# Patient Record
Sex: Female | Born: 1960 | Race: White | Hispanic: No | Smoking: Never smoker
Health system: Southern US, Community
[De-identification: ages and names within clinical notes are randomized; demographics above are authoritative.]

## PROBLEM LIST (undated history)

## (undated) DIAGNOSIS — F419 Anxiety disorder, unspecified: Secondary | ICD-10-CM

## (undated) DIAGNOSIS — R0602 Shortness of breath: Secondary | ICD-10-CM

## (undated) DIAGNOSIS — M1990 Unspecified osteoarthritis, unspecified site: Secondary | ICD-10-CM

## (undated) DIAGNOSIS — F329 Major depressive disorder, single episode, unspecified: Secondary | ICD-10-CM

## (undated) DIAGNOSIS — E7800 Pure hypercholesterolemia, unspecified: Secondary | ICD-10-CM

## (undated) DIAGNOSIS — F32A Depression, unspecified: Secondary | ICD-10-CM

## (undated) DIAGNOSIS — I10 Essential (primary) hypertension: Secondary | ICD-10-CM

## (undated) DIAGNOSIS — S022XXA Fracture of nasal bones, initial encounter for closed fracture: Secondary | ICD-10-CM

## (undated) HISTORY — PX: FRACTURE SURGERY: SHX138

## (undated) HISTORY — PX: SKIN GRAFT: SHX250

## (undated) HISTORY — PX: CERVICAL SPINE SURGERY: SHX589

## (undated) HISTORY — PX: BACK SURGERY: SHX140

## (undated) HISTORY — PX: FACIAL RECONSTRUCTION SURGERY: SHX631

---

## 2005-05-08 ENCOUNTER — Ambulatory Visit (HOSPITAL_COMMUNITY): Admission: RE | Admit: 2005-05-08 | Discharge: 2005-05-08 | Payer: Self-pay | Admitting: Obstetrics & Gynecology

## 2006-11-24 ENCOUNTER — Emergency Department (HOSPITAL_COMMUNITY): Admission: EM | Admit: 2006-11-24 | Discharge: 2006-11-24 | Payer: Self-pay | Admitting: Emergency Medicine

## 2006-12-02 ENCOUNTER — Ambulatory Visit: Payer: Self-pay | Admitting: Family Medicine

## 2006-12-02 DIAGNOSIS — I10 Essential (primary) hypertension: Secondary | ICD-10-CM | POA: Insufficient documentation

## 2006-12-02 DIAGNOSIS — E739 Lactose intolerance, unspecified: Secondary | ICD-10-CM

## 2006-12-02 DIAGNOSIS — E669 Obesity, unspecified: Secondary | ICD-10-CM | POA: Insufficient documentation

## 2006-12-02 DIAGNOSIS — S93409A Sprain of unspecified ligament of unspecified ankle, initial encounter: Secondary | ICD-10-CM | POA: Insufficient documentation

## 2006-12-02 DIAGNOSIS — F411 Generalized anxiety disorder: Secondary | ICD-10-CM | POA: Insufficient documentation

## 2006-12-02 DIAGNOSIS — M549 Dorsalgia, unspecified: Secondary | ICD-10-CM | POA: Insufficient documentation

## 2006-12-02 LAB — CONVERTED CEMR LAB: Pap Smear: NORMAL

## 2006-12-03 ENCOUNTER — Encounter (INDEPENDENT_AMBULATORY_CARE_PROVIDER_SITE_OTHER): Payer: Self-pay | Admitting: Family Medicine

## 2006-12-03 ENCOUNTER — Telehealth (INDEPENDENT_AMBULATORY_CARE_PROVIDER_SITE_OTHER): Payer: Self-pay | Admitting: *Deleted

## 2006-12-03 ENCOUNTER — Telehealth (INDEPENDENT_AMBULATORY_CARE_PROVIDER_SITE_OTHER): Payer: Self-pay | Admitting: Family Medicine

## 2006-12-08 ENCOUNTER — Encounter (INDEPENDENT_AMBULATORY_CARE_PROVIDER_SITE_OTHER): Payer: Self-pay | Admitting: Family Medicine

## 2007-03-13 ENCOUNTER — Encounter (INDEPENDENT_AMBULATORY_CARE_PROVIDER_SITE_OTHER): Payer: Self-pay | Admitting: Family Medicine

## 2007-09-11 ENCOUNTER — Encounter (INDEPENDENT_AMBULATORY_CARE_PROVIDER_SITE_OTHER): Payer: Self-pay | Admitting: Family Medicine

## 2009-02-20 ENCOUNTER — Emergency Department (HOSPITAL_COMMUNITY): Admission: EM | Admit: 2009-02-20 | Discharge: 2009-02-20 | Payer: Self-pay | Admitting: Emergency Medicine

## 2010-09-14 NOTE — Op Note (Signed)
NAME:  Betty Fuentes, Betty Fuentes               ACCOUNT NO.:  [id number]   MEDICAL RECORD NO.:  [id number]          PATIENT TYPE:  AMB   LOCATION:  DAY                           FACILITY:  [facility]   PHYSICIAN:  Lazaro Arms, M.D.   DATE OF BIRTH:  Sep 26, 1960   DATE OF PROCEDURE:  05/08/2005  DATE OF DISCHARGE:                                 OPERATIVE REPORT   PREOPERATIVE DIAGNOSIS:  Multiparous female, desires permanent  sterilization.   POSTOPERATIVE DIAGNOSIS:  Multiparous female, desires permanent  sterilization.   PROCEDURE:  Laparoscopic tubal ligation.   SURGEON:  Dr. Despina Hidden.   ANESTHESIA:  General endotracheal.   FINDINGS:  The patient had normal uterus, tubes and ovaries. Normal pelvis.   DESCRIPTION OF PROCEDURE:  The patient was taken to the operating room and  placed in the supine position, underwent general endotracheal anesthesia.  She was placed in dorsal lithotomy position, prepped and draped in usual  sterile fashion. Incision was made in the umbilicus. Veress needle was  placed. The peritoneal cavity was insufflated. Under direct visualization, a  nonbladed trocar was used, and the video laparoscope was used to do a  placement under direct visualization. Peritoneal cavity was identified.  Uterus, tubes and ovaries were all found to be normal. Tubes were burned  with electrocautery unit, the distal isthmic ampullary region of tube  bilaterally, approximately 2.5-cm segment bilaterally. There was good  hemostasis. The instruments were removed. The gas was allowed to escape. The  fascia was closed in a single 0 Vicryl suture, and the skin was closed using  skin staples. The patient tolerated the procedure well. She was taken to the  recovery room in good and stable condition. All counts were correct.      Lazaro Arms, M.D.  Electronically Signed     LHE/MEDQ  D:  05/08/2005  T:  05/08/2005  Job:  [id number]

## 2010-11-15 ENCOUNTER — Other Ambulatory Visit (HOSPITAL_COMMUNITY): Payer: Self-pay | Admitting: Family Medicine

## 2010-11-15 DIAGNOSIS — N63 Unspecified lump in unspecified breast: Secondary | ICD-10-CM

## 2010-11-28 ENCOUNTER — Inpatient Hospital Stay (HOSPITAL_COMMUNITY): Admission: RE | Admit: 2010-11-28 | Payer: Self-pay | Source: Ambulatory Visit

## 2010-12-12 ENCOUNTER — Ambulatory Visit (HOSPITAL_COMMUNITY)
Admission: RE | Admit: 2010-12-12 | Discharge: 2010-12-12 | Disposition: A | Discharge status: Home / Self Care | Payer: Self-pay | Source: Ambulatory Visit | Attending: Family Medicine | Admitting: Family Medicine

## 2010-12-12 DIAGNOSIS — N63 Unspecified lump in unspecified breast: Secondary | ICD-10-CM | POA: Insufficient documentation

## 2011-09-20 ENCOUNTER — Inpatient Hospital Stay (HOSPITAL_COMMUNITY)
Admission: EM | Admit: 2011-09-20 | Discharge: 2011-09-24 | DRG: 372 | Disposition: A | Discharge status: Home / Self Care | Payer: Self-pay | Attending: Internal Medicine | Admitting: Internal Medicine

## 2011-09-20 ENCOUNTER — Encounter (HOSPITAL_COMMUNITY): Payer: Self-pay | Admitting: *Deleted

## 2011-09-20 ENCOUNTER — Emergency Department (HOSPITAL_COMMUNITY): Payer: Self-pay

## 2011-09-20 DIAGNOSIS — N179 Acute kidney failure, unspecified: Secondary | ICD-10-CM

## 2011-09-20 DIAGNOSIS — F329 Major depressive disorder, single episode, unspecified: Secondary | ICD-10-CM | POA: Diagnosis present

## 2011-09-20 DIAGNOSIS — L03317 Cellulitis of buttock: Secondary | ICD-10-CM | POA: Diagnosis present

## 2011-09-20 DIAGNOSIS — N19 Unspecified kidney failure: Secondary | ICD-10-CM

## 2011-09-20 DIAGNOSIS — L03319 Cellulitis of trunk, unspecified: Secondary | ICD-10-CM | POA: Diagnosis present

## 2011-09-20 DIAGNOSIS — E739 Lactose intolerance, unspecified: Secondary | ICD-10-CM

## 2011-09-20 DIAGNOSIS — S93409A Sprain of unspecified ligament of unspecified ankle, initial encounter: Secondary | ICD-10-CM

## 2011-09-20 DIAGNOSIS — D649 Anemia, unspecified: Secondary | ICD-10-CM | POA: Diagnosis present

## 2011-09-20 DIAGNOSIS — D72829 Elevated white blood cell count, unspecified: Secondary | ICD-10-CM | POA: Diagnosis present

## 2011-09-20 DIAGNOSIS — R197 Diarrhea, unspecified: Secondary | ICD-10-CM

## 2011-09-20 DIAGNOSIS — L02219 Cutaneous abscess of trunk, unspecified: Secondary | ICD-10-CM | POA: Diagnosis present

## 2011-09-20 DIAGNOSIS — E669 Obesity, unspecified: Secondary | ICD-10-CM

## 2011-09-20 DIAGNOSIS — E875 Hyperkalemia: Secondary | ICD-10-CM

## 2011-09-20 DIAGNOSIS — E860 Dehydration: Secondary | ICD-10-CM | POA: Diagnosis present

## 2011-09-20 DIAGNOSIS — A4902 Methicillin resistant Staphylococcus aureus infection, unspecified site: Secondary | ICD-10-CM | POA: Diagnosis present

## 2011-09-20 DIAGNOSIS — E872 Acidosis, unspecified: Secondary | ICD-10-CM | POA: Diagnosis present

## 2011-09-20 DIAGNOSIS — F411 Generalized anxiety disorder: Secondary | ICD-10-CM | POA: Diagnosis present

## 2011-09-20 DIAGNOSIS — F3289 Other specified depressive episodes: Secondary | ICD-10-CM | POA: Diagnosis present

## 2011-09-20 DIAGNOSIS — I10 Essential (primary) hypertension: Secondary | ICD-10-CM | POA: Diagnosis present

## 2011-09-20 DIAGNOSIS — E876 Hypokalemia: Secondary | ICD-10-CM | POA: Diagnosis not present

## 2011-09-20 DIAGNOSIS — A0472 Enterocolitis due to Clostridium difficile, not specified as recurrent: Principal | ICD-10-CM | POA: Diagnosis present

## 2011-09-20 DIAGNOSIS — E119 Type 2 diabetes mellitus without complications: Secondary | ICD-10-CM | POA: Diagnosis present

## 2011-09-20 DIAGNOSIS — L0231 Cutaneous abscess of buttock: Secondary | ICD-10-CM | POA: Diagnosis present

## 2011-09-20 DIAGNOSIS — M549 Dorsalgia, unspecified: Secondary | ICD-10-CM

## 2011-09-20 DIAGNOSIS — E7800 Pure hypercholesterolemia, unspecified: Secondary | ICD-10-CM | POA: Diagnosis present

## 2011-09-20 DIAGNOSIS — E139 Other specified diabetes mellitus without complications: Secondary | ICD-10-CM | POA: Diagnosis present

## 2011-09-20 HISTORY — DX: Anxiety disorder, unspecified: F41.9

## 2011-09-20 HISTORY — DX: Pure hypercholesterolemia, unspecified: E78.00

## 2011-09-20 HISTORY — DX: Major depressive disorder, single episode, unspecified: F32.9

## 2011-09-20 HISTORY — DX: Depression, unspecified: F32.A

## 2011-09-20 HISTORY — DX: Essential (primary) hypertension: I10

## 2011-09-20 LAB — COMPREHENSIVE METABOLIC PANEL
ALT: 7 U/L (ref 0–35)
AST: 13 U/L (ref 0–37)
Albumin: 4.3 g/dL (ref 3.5–5.2)
Alkaline Phosphatase: 143 U/L — ABNORMAL HIGH (ref 39–117)
CO2: 11 mEq/L — ABNORMAL LOW (ref 19–32)
Creatinine, Ser: 2.21 mg/dL — ABNORMAL HIGH (ref 0.50–1.10)
GFR calc non Af Amer: 24 mL/min — ABNORMAL LOW (ref >90)
Glucose, Bld: 209 mg/dL — ABNORMAL HIGH (ref 70–99)
Potassium: 4.8 mEq/L (ref 3.5–5.1)
Sodium: 135 mEq/L (ref 135–145)
Total Bilirubin: 0.3 mg/dL (ref 0.3–1.2)
Total Protein: 9.8 g/dL — ABNORMAL HIGH (ref 6.0–8.3)

## 2011-09-20 LAB — PROCALCITONIN: Procalcitonin: 0.11 ng/mL

## 2011-09-20 LAB — DIFFERENTIAL
Basophils Relative: 0 % (ref 0–1)
Eosinophils Absolute: 0.3 10*3/uL (ref 0.0–0.7)
Neutro Abs: 24.8 10*3/uL — ABNORMAL HIGH (ref 1.7–7.7)

## 2011-09-20 LAB — CBC: WBC: 30.6 10*3/uL — ABNORMAL HIGH (ref 4.0–10.5)

## 2011-09-20 LAB — LACTIC ACID, PLASMA: Lactic Acid, Venous: 0.7 mmol/L (ref 0.5–2.2)

## 2011-09-20 MED ORDER — VANCOMYCIN HCL IN DEXTROSE 1-5 GM/200ML-% IV SOLN
1000.0000 mg | Freq: Once | INTRAVENOUS | Status: AC
  Administered 2011-09-20: 1000 mg via INTRAVENOUS
  Filled 2011-09-20: qty 200

## 2011-09-20 MED ORDER — SODIUM CHLORIDE 0.9 % IV BOLUS (SEPSIS)
1000.0000 mL | Freq: Once | INTRAVENOUS | Status: AC
  Administered 2011-09-20: 1000 mL via INTRAVENOUS

## 2011-09-20 MED ORDER — SODIUM CHLORIDE 0.9 % IV SOLN: Freq: Once | INTRAVENOUS | Status: DC

## 2011-09-20 NOTE — H&P (Signed)
PCP:   Reynolds Bowl, MD, MD   Chief Complaint:  Buttock pain  HPI: 51 year old female who approximately one week ago started having some drainage from a boil in her right buttock perineal area that has progressively worsened over the last week and now has a significant open wound care and several other areas that have developed with some mild drainage. She's been having diarrhea she says for at least 2-3 months that has not been assessed. She denies any fevers, nausea, vomiting, abdominal pain. She thinks that her diarrhea might be from sure stress anxiety irritable bowel syndrome but is not sure. She has not had any sick contacts. She says after the diarrhea she did have overall bottom prior to the abscesses forming. Her diarrhea has been nonbloody.  Review of Systems:  Otherwise negative  Past Medical History: Past Medical History  Diagnosis Date  . Diabetes mellitus   . Hypertension   . High cholesterol   . Depression   . Anxiety    Past Surgical History  Procedure Date  . Facial reconstruction surgery   . Back surgery   . Cervical spine surgery   . Skin graft     Medications: Prior to Admission medications   Not on File    Allergies:  No Known Allergies  Social History:  reports that she has never smoked. She does not have any smokeless tobacco history on file. She reports that she does not drink alcohol or use illicit drugs.   Physical Exam: Filed Vitals:   09/20/11 1841  BP: 111/79  Pulse: 120  Temp: 98.2 F (36.8 C)  TempSrc: Oral  Resp: 16  Height: 5\' 2"  (1.575 m)  Weight: 64.411 kg (142 lb)  SpO2: 100%   BP 107/68  Pulse 119  Temp(Src) 97.6 F (36.4 C) (Oral)  Resp 20  Ht 5\' 2"  (1.575 m)  Wt 81.194 kg (179 lb)  BMI 32.74 kg/m2  SpO2 98% General appearance: alert, cooperative and no distress Lungs: clear to auscultation bilaterally Heart: regular rate and rhythm, S1, S2 normal, no murmur, click, rub or gallop Abdomen: soft, non-tender;  bowel sounds normal; no masses,  no organomegaly Extremities: extremities normal, atraumatic, no cyanosis or edema Pulses: 2+ and symmetric Skin: Multiple small areas at right gluteal perineal area with a moderate open wound that measures at least 3 cm in diameter with at least 2 other small abscesses forming with mild draining with mild surrounding erythema consistent with a wound developed from an abscess with mild surrounding cellulitis. Neurologic: Grossly normal    Labs on Admission:   [facility] 09/20/11 1913  NA 135  K 4.8  CL 107  CO2 11*  GLUCOSE 209*  BUN 98*  CREATININE 2.21*  CALCIUM 11.8*  MG --  PHOS --    Basename 09/20/11 1913  AST 13  ALT 7  ALKPHOS 143*  BILITOT 0.3  PROT 9.8*  ALBUMIN 4.3    Basename 09/20/11 1913  WBC 30.6*  NEUTROABS 24.8*  HGB 13.7  HCT 42.1  MCV 87.5  PLT 399    Radiological Exams on Admission: No results found.  Assessment/Plan Present on Admission:  51 year old female with buttock perineal abscess and cellulitis  .Cellulitis and abscess of buttock .Renal failure, acute .Hypercalcemia .HYPERTENSION .ANXIETY .Leukocytosis  For her wound on her bottom we'll obtain wound care consult and provide her with some IV antibiotics in the form of vancomycin. For her diarrhea which I believe is probably the culprit of getting a superimposed bacterial  skin infection I'm going to send off for stool cultures and ruled her out for C. difficile. This wound is probably to take 1-2 months to heal she is aware of this. She also has significant acidosis and renal failure with hypercalcemia. She states she has not been eating and drinking very well for a week now. Her lactic acid level is normal along with normal portal calcitonin level. We'll provide her with some gentle IV fluids overnight and reassess her renal function in the morning and her CO2 levels along with her Calcium level. Depending on what her results are in the morning this may  need further workup. Urinalysis is also pending to assess for any proteinuria. Further recommendations pending above response. She will need home health and wound care at discharge.  Archana Eckman A [phone] 09/20/2011, 10:26 PM

## 2011-09-20 NOTE — ED Notes (Signed)
Multiple abscesses to buttock and groin area x 1 1/2 wks.  Seen by PCP and treated with abx with no relief.  Reports area has gotten larger.

## 2011-09-20 NOTE — ED Notes (Signed)
Pt's friend who cares for pt reports pt has had a pain pill addiction for several years.  Reports pt has been in bed x 7 days.  When he went to check on pt today, pt could not dress herself.  Reports pt received pain rx from PCP last week and states there are none left.

## 2011-09-20 NOTE — ED Provider Notes (Signed)
History     CSN: [id number]  Arrival date & time 09/20/11  4098   First MD Initiated Contact with Patient 09/20/11 1856      Chief Complaint  Patient presents with  . Abscess    (Consider location/radiation/quality/duration/timing/severity/associated sxs/prior treatment) Patient is a 51 y.o. female presenting with abscess. The history is provided by the patient.  Abscess   She had a boil, and her right gluteal area about 10 days ago. She went to her PCP who prescribed some pain medicine for her. About that time, the boil burst. Since then, she has had an open wound there and it is painful. She states that pain is 9/10. A neighbor has tried to treat it with antibiotic ointment with no benefit. She's tried some warm soaks including in Epsom salts but this has not been helpful. She has subjective fever and has had chills but no sweats.  Past Medical History  Diagnosis Date  . Diabetes mellitus   . Hypertension   . High cholesterol   . Depression   . Anxiety     Past Surgical History  Procedure Date  . Facial reconstruction surgery   . Back surgery   . Cervical spine surgery   . Skin graft     No family history on file.  History  Substance Use Topics  . Smoking status: Never Smoker   . Smokeless tobacco: Not on file  . Alcohol Use: No    OB History    Grav Para Term Preterm Abortions TAB SAB Ect Mult Living                  Review of Systems  All other systems reviewed and are negative.    Allergies  Review of patient's allergies indicates no known allergies.  Home Medications  No current outpatient prescriptions on file.  BP 111/79  Pulse 120  Temp(Src) 98.2 F (36.8 C) (Oral)  Resp 16  Ht 5\' 2"  (1.575 m)  Wt 142 lb (64.411 kg)  BMI 25.97 kg/m2  SpO2 100%  Physical Exam  Nursing note and vitals reviewed.  51 year old female who is resting comfortably and in no acute distress. Vital signs are significant for tachycardia with heart rate 120.  Oxygen saturation is 100% which is normal. Head is normocephalic and atraumatic. PERRLA, EOMI. Oropharynx is clear. Neck is nontender and supple. Back is nontender. Lungs are clear without rales, wheezes, or rhonchi. Heart has regular rhythm without murmur. Abdomen is soft, flat, nontender without masses or hepatosplenomegaly. There is an open wound in the inferior aspect of the right gluteal area. This extends into subcutaneous fat but does not involve muscle. There is no erythema of the skin in no. And drainage noted. There are 2 small subcutaneous nodules in the left gluteal area and proximal thigh. These have no fluctuance and there is no overlying erythema. Extremities have no cyanosis or edema. Skin is without other rash. Neurologic: Mental status is normal, cranial nerves are intact, there are no focal motor or sensory deficits.  ED Course  Procedures (including critical care time)  Results for orders placed during the hospital encounter of 09/20/11  CBC      Component Value Range   WBC 30.6 (*) 4.0 - 10.5 (K/uL)   RBC 4.81  3.87 - 5.11 (MIL/uL)   Hemoglobin 13.7  12.0 - 15.0 (g/dL)   HCT 11.9  14.7 - 82.9 (%)   MCV 87.5  78.0 - 100.0 (fL)   MCH 28.5  26.0 - 34.0 (pg)   MCHC 32.5  30.0 - 36.0 (g/dL)   RDW 16.1  09.6 - 04.5 (%)   Platelets 399  150 - 400 (K/uL)  DIFFERENTIAL      Component Value Range   Neutrophils Relative 81 (*) 43 - 77 (%)   Lymphocytes Relative 11 (*) 12 - 46 (%)   Monocytes Relative 7  3 - 12 (%)   Eosinophils Relative 1  0 - 5 (%)   Basophils Relative 0  0 - 1 (%)   Band Neutrophils 0  0 - 10 (%)   Metamyelocytes Relative 0     Myelocytes 0     Promyelocytes Absolute 0     Blasts 0     nRBC 0  0 (/100 WBC)   Neutro Abs 24.8 (*) 1.7 - 7.7 (K/uL)   Lymphs Abs 3.4  0.7 - 4.0 (K/uL)   Monocytes Absolute 2.1 (*) 0.1 - 1.0 (K/uL)   Eosinophils Absolute 0.3  0.0 - 0.7 (K/uL)   Basophils Absolute 0.0  0.0 - 0.1 (K/uL)  COMPREHENSIVE METABOLIC PANEL       Component Value Range   Sodium 135  135 - 145 (mEq/L)   Potassium 4.8  3.5 - 5.1 (mEq/L)   Chloride 107  96 - 112 (mEq/L)   CO2 11 (*) 19 - 32 (mEq/L)   Glucose, Bld 209 (*) 70 - 99 (mg/dL)   BUN 98 (*) 6 - 23 (mg/dL)   Creatinine, Ser 4.09 (*) 0.50 - 1.10 (mg/dL)   Calcium 81.1 (*) 8.4 - 10.5 (mg/dL)   Total Protein 9.8 (*) 6.0 - 8.3 (g/dL)   Albumin 4.3  3.5 - 5.2 (g/dL)   AST 13  0 - 37 (U/L)   ALT 7  0 - 35 (U/L)   Alkaline Phosphatase 143 (*) 39 - 117 (U/L)   Total Bilirubin 0.3  0.3 - 1.2 (mg/dL)   GFR calc non Af Amer 24 (*) >90 (mL/min)   GFR calc Af Amer 28 (*) >90 (mL/min)  SEDIMENTATION RATE      Component Value Range   Sed Rate 52 (*) 0 - 22 (mm/hr)  LACTIC ACID, PLASMA      Component Value Range   Lactic Acid, Venous 0.7  0.5 - 2.2 (mmol/L)  PROCALCITONIN      Component Value Range   Procalcitonin 0.11    BASIC METABOLIC PANEL      Component Value Range   Sodium 136  135 - 145 (mEq/L)   Potassium 4.1  3.5 - 5.1 (mEq/L)   Chloride 107  96 - 112 (mEq/L)   CO2 12 (*) 19 - 32 (mEq/L)   Glucose, Bld 156 (*) 70 - 99 (mg/dL)   BUN 94 (*) 6 - 23 (mg/dL)   Creatinine, Ser 9.14 (*) 0.50 - 1.10 (mg/dL)   Calcium 78.2 (*) 8.4 - 10.5 (mg/dL)   GFR calc non Af Amer 33 (*) >90 (mL/min)   GFR calc Af Amer 38 (*) >90 (mL/min)  CBC      Component Value Range   WBC 27.3 (*) 4.0 - 10.5 (K/uL)   RBC 4.38  3.87 - 5.11 (MIL/uL)   Hemoglobin 12.2  12.0 - 15.0 (g/dL)   HCT 95.6  21.3 - 08.6 (%)   MCV 87.0  78.0 - 100.0 (fL)   MCH 27.9  26.0 - 34.0 (pg)   MCHC 32.0  30.0 - 36.0 (g/dL)   RDW 57.8  46.9 - 62.9 (%)  Platelets 325  150 - 400 (K/uL)  CLOSTRIDIUM DIFFICILE BY PCR      Component Value Range   C difficile by pcr POSITIVE (*) NEGATIVE   WOUND CULTURE      Component Value Range   Specimen Description VULVA     Special Requests Normal     Gram Stain PENDING     Culture Culture reincubated for better growth     Report Status PENDING    CBC      Component Value  Range   WBC 16.4 (*) 4.0 - 10.5 (K/uL)   RBC 3.91  3.87 - 5.11 (MIL/uL)   Hemoglobin 11.0 (*) 12.0 - 15.0 (g/dL)   HCT 95.6 (*) 21.3 - 46.0 (%)   MCV 85.2  78.0 - 100.0 (fL)   MCH 28.1  26.0 - 34.0 (pg)   MCHC 33.0  30.0 - 36.0 (g/dL)   RDW 08.6  57.8 - 46.9 (%)   Platelets 289  150 - 400 (K/uL)  BASIC METABOLIC PANEL      Component Value Range   Sodium 137  135 - 145 (mEq/L)   Potassium 3.3 (*) 3.5 - 5.1 (mEq/L)   Chloride 107  96 - 112 (mEq/L)   CO2 17 (*) 19 - 32 (mEq/L)   Glucose, Bld 166 (*) 70 - 99 (mg/dL)   BUN 47 (*) 6 - 23 (mg/dL)   Creatinine, Ser 6.29  0.50 - 1.10 (mg/dL)   Calcium 9.9  8.4 - 52.8 (mg/dL)   GFR calc non Af Amer 64 (*) >90 (mL/min)   GFR calc Af Amer 75 (*) >90 (mL/min)   Ct Pelvis Wo Contrast  09/20/2011  *RADIOLOGY REPORT*  Clinical Data:  Blisters bilateral buttocks for 6 days.  Pain.  CT PELVIS WITHOUT CONTRAST  Technique:  Multidetector CT imaging of the pelvis was performed following the standard protocol without intravenous contrast.  Comparison:  CT abdomen and pelvis 02/20/2009  Findings:  Soft tissue infiltration in the subcutaneous fat over the buttock regions bilaterally.  This may represent inflammation or edema.  Small soft tissue calcification on the right may represent injection granuloma.  No focal loculated fluid collection is demonstrated.  No evidence of soft tissue abscess.  Visualized pelvic organs appear intact.  The uterus and adnexal structures are not enlarged.  No inflammatory changes in the sigmoid colon.  The appendix is normal.  Visualized small and large bowel loops are not distended.  Degenerative changes in the lower lumbar spine.  IMPRESSION: Minimal infiltration in the subcutaneous fat over the buttocks.  No evidence of soft tissue abscess.  Original Report Authenticated By: Marlon Pel, M.D.      1. Renal failure   2. Hypercalcemia   3. Abscess, gluteal, right   4. Hypertension     CRITICAL CARE Performed by:  Dione Booze   Total critical care time: 40 minutes  Critical care time was exclusive of separately billable procedures and treating other patients.  Critical care was necessary to treat or prevent imminent or life-threatening deterioration.  Critical care was time spent personally by me on the following activities: development of treatment plan with patient and/or surrogate as well as nursing, discussions with consultants, evaluation of patient's response to treatment, examination of patient, obtaining history from patient or surrogate, ordering and performing treatments and interventions, ordering and review of laboratory studies, ordering and review of radiographic studies, pulse oximetry and re-evaluation of patient's condition.   MDM  Open wound in the right gluteal area.  This will need to be treated with daily dressing change and allowed to heal by second intention. CT scan will be obtained to make sure there is not additional abscess area. In light of tachycardia, she will be given a fluid bolus. Laboratory workup has been initiated as well.  WBC has come back markedly elevated at 30.6 with a left shift. There is no report of immature cells. Metabolic panel is markedly abnormal with metabolic acidosis but only minimally elevated anion gap. Lactic acid is normal and within normal anion gap I doubt ketoacidosis. BUN is markedly elevated and creatinine is moderately elevated which would indicate likely dehydration. Often phosphatases mildly elevated and calcium is very high at 11.8. Total protein is elevated with normal albumin which raises the possibility of a plasma cell dyscrasia which could account for renal insufficiency and hypercalcemia.  Case is discussed with Dr. Onalee Hua who agrees to admit the patient.      Dione Booze, MD 09/22/11 2253

## 2011-09-21 ENCOUNTER — Encounter (HOSPITAL_COMMUNITY): Payer: Self-pay | Admitting: General Practice

## 2011-09-21 DIAGNOSIS — A0472 Enterocolitis due to Clostridium difficile, not specified as recurrent: Secondary | ICD-10-CM

## 2011-09-21 DIAGNOSIS — N179 Acute kidney failure, unspecified: Secondary | ICD-10-CM

## 2011-09-21 DIAGNOSIS — L03317 Cellulitis of buttock: Secondary | ICD-10-CM

## 2011-09-21 DIAGNOSIS — E872 Acidosis: Secondary | ICD-10-CM | POA: Diagnosis present

## 2011-09-21 DIAGNOSIS — L0231 Cutaneous abscess of buttock: Secondary | ICD-10-CM

## 2011-09-21 LAB — CBC
HCT: 38.1 % (ref 36.0–46.0)
Hemoglobin: 12.2 g/dL (ref 12.0–15.0)
MCV: 87 fL (ref 78.0–100.0)
RBC: 4.38 MIL/uL (ref 3.87–5.11)
WBC: 27.3 10*3/uL — ABNORMAL HIGH (ref 4.0–10.5)

## 2011-09-21 LAB — CLOSTRIDIUM DIFFICILE BY PCR: Toxigenic C. Difficile by PCR: POSITIVE — AB

## 2011-09-21 LAB — BASIC METABOLIC PANEL
CO2: 12 mEq/L — ABNORMAL LOW (ref 19–32)
Chloride: 107 mEq/L (ref 96–112)
Creatinine, Ser: 1.73 mg/dL — ABNORMAL HIGH (ref 0.50–1.10)
Potassium: 4.1 mEq/L (ref 3.5–5.1)
Sodium: 136 mEq/L (ref 135–145)

## 2011-09-21 MED ORDER — HYDROMORPHONE HCL PF 1 MG/ML IJ SOLN: 1.0000 mg | INTRAMUSCULAR | Status: DC | PRN

## 2011-09-21 MED ORDER — AMITRIPTYLINE HCL 25 MG PO TABS
50.0000 mg | ORAL_TABLET | Freq: Every day | ORAL | Status: DC
  Administered 2011-09-21 – 2011-09-23 (×3): 50 mg via ORAL
  Filled 2011-09-21 (×3): qty 2

## 2011-09-21 MED ORDER — METOPROLOL TARTRATE 50 MG PO TABS
50.0000 mg | ORAL_TABLET | Freq: Two times a day (BID) | ORAL | Status: DC
  Administered 2011-09-21 – 2011-09-23 (×4): 50 mg via ORAL
  Filled 2011-09-21 (×5): qty 1

## 2011-09-21 MED ORDER — CITALOPRAM HYDROBROMIDE 20 MG PO TABS
40.0000 mg | ORAL_TABLET | Freq: Every day | ORAL | Status: DC
  Administered 2011-09-21 – 2011-09-24 (×4): 40 mg via ORAL
  Filled 2011-09-21 (×2): qty 2
  Filled 2011-09-21: qty 1
  Filled 2011-09-21: qty 2

## 2011-09-21 MED ORDER — VANCOMYCIN HCL IN DEXTROSE 1-5 GM/200ML-% IV SOLN
1000.0000 mg | INTRAVENOUS | Status: DC
  Administered 2011-09-21 – 2011-09-23 (×3): 1000 mg via INTRAVENOUS
  Filled 2011-09-21 (×4): qty 200

## 2011-09-21 MED ORDER — HYDROCODONE-ACETAMINOPHEN 5-325 MG PO TABS
1.0000 | ORAL_TABLET | ORAL | Status: DC | PRN
  Administered 2011-09-21: 1 via ORAL
  Filled 2011-09-21: qty 1

## 2011-09-21 MED ORDER — ONDANSETRON HCL 4 MG/2ML IJ SOLN: 4.0000 mg | Freq: Three times a day (TID) | INTRAMUSCULAR | Status: DC | PRN

## 2011-09-21 MED ORDER — SODIUM CHLORIDE 0.9 % IJ SOLN
3.0000 mL | Freq: Two times a day (BID) | INTRAMUSCULAR | Status: DC
  Administered 2011-09-21 – 2011-09-24 (×6): 3 mL via INTRAVENOUS
  Filled 2011-09-21 (×7): qty 3

## 2011-09-21 MED ORDER — SIMVASTATIN 20 MG PO TABS
40.0000 mg | ORAL_TABLET | Freq: Every day | ORAL | Status: DC
  Administered 2011-09-21 – 2011-09-23 (×3): 40 mg via ORAL
  Filled 2011-09-21: qty 2
  Filled 2011-09-21: qty 1
  Filled 2011-09-21: qty 2

## 2011-09-21 MED ORDER — SODIUM CHLORIDE 0.9 % IV SOLN: INTRAVENOUS | Status: DC

## 2011-09-21 MED ORDER — CLONAZEPAM 0.5 MG PO TABS: 0.5000 mg | ORAL_TABLET | Freq: Three times a day (TID) | ORAL | Status: DC | PRN

## 2011-09-21 MED ORDER — METRONIDAZOLE 500 MG PO TABS
500.0000 mg | ORAL_TABLET | Freq: Three times a day (TID) | ORAL | Status: DC
  Administered 2011-09-21: 500 mg via ORAL
  Filled 2011-09-21: qty 1

## 2011-09-21 MED ORDER — VANCOMYCIN 50 MG/ML ORAL SOLUTION
125.0000 mg | Freq: Four times a day (QID) | ORAL | Status: DC
  Administered 2011-09-21 – 2011-09-24 (×12): 125 mg via ORAL
  Filled 2011-09-21 (×18): qty 2.5

## 2011-09-21 MED ORDER — POTASSIUM CHLORIDE IN NACL 20-0.9 MEQ/L-% IV SOLN
INTRAVENOUS | Status: DC
  Administered 2011-09-21: 1000 mL via INTRAVENOUS

## 2011-09-21 MED ORDER — SODIUM CHLORIDE 4 MEQ/ML IV SOLN
INTRAVENOUS | Status: DC
  Administered 2011-09-21: 13:00:00 via INTRAVENOUS
  Filled 2011-09-21 (×4): qty 9.7

## 2011-09-21 NOTE — Consult Note (Signed)
ANTIBIOTIC CONSULT NOTE - INITIAL  Pharmacy Consult for Vancomycin Indication: cellulitis of buttock  No Known Allergies  Patient Measurements: Height: 5\' 2"  (157.5 cm) Weight: 179 lb (81.194 kg) IBW/kg (Calculated) : 50.1   Vital Signs: Temp: 97.8 F (36.6 C) (05/25 0659) Temp src: Oral (05/25 0659) BP: 153/76 mmHg (05/25 0659) Pulse Rate: 60  (05/25 0659) Intake/Output from previous day: 05/24 0701 - 05/25 0700 In: 321.3 [I.V.:321.3] Out: 300 [Urine:300] Intake/Output from this shift: Total I/O In: 120 [P.O.:120] Out: -   Labs:  Basename 09/21/11 0633 09/20/11 1913  WBC 27.3* 30.6*  HGB 12.2 13.7  PLT 325 399  LABCREA -- --  CREATININE 1.73* 2.21*   Estimated Creatinine Clearance: 37.5 ml/min (by C-G formula based on Cr of 1.73). No results found for this basename: VANCOTROUGH:2,VANCOPEAK:2,VANCORANDOM:2,GENTTROUGH:2,GENTPEAK:2,GENTRANDOM:2,TOBRATROUGH:2,TOBRAPEAK:2,TOBRARND:2,AMIKACINPEAK:2,AMIKACINTROU:2,AMIKACIN:2, in the last 72 hours   Microbiology: Recent Results (from the past 720 hour(s))  CLOSTRIDIUM DIFFICILE BY PCR     Status: Abnormal   Collection Time   09/21/11  1:08 AM      Component Value Range Status Comment   C difficile by pcr POSITIVE (*) NEGATIVE  Final    Medical History: Past Medical History  Diagnosis Date  . Diabetes mellitus   . Hypertension   . High cholesterol   . Depression   . Anxiety    Medications:  Scheduled:    . metroNIDAZOLE  500 mg Oral Q8H  . sodium chloride  1,000 mL Intravenous Once  . sodium chloride  3 mL Intravenous Q12H  . vancomycin  1,000 mg Intravenous Once  . vancomycin  1,000 mg Intravenous Q24H  . DISCONTD: sodium chloride   Intravenous Once  . DISCONTD: sodium chloride   Intravenous STAT   Assessment: 50yoF with poor renal fxn, c/o wound and drainage of buttock and perineal area Estimated Creatinine Clearance: 37.5 ml/min (by C-G formula based on Cr of 1.73).  Goal of Therapy:  Vancomycin  trough level 10-15 mcg/ml Eradicate infection.  Plan: Vancomycin 1gm iv q24hrs Monitor SCr, labs per protocol F/U culture data as available  Valrie Hart A 09/21/2011,9:12 AM

## 2011-09-21 NOTE — Progress Notes (Signed)
CRITICAL VALUE ALERT  Critical value received:  Positive C-diff Date of notification:09/21/2011 Time of notification:0229 Critical value read back:yes  Nurse who received alert:Sarah Christella Hartigan MD notified (1st page):  Dr. Onalee Hua Time of first ZOXW:[id number] MD notified (2nd page):  Time of second page:  Responding MD: Dr. Onalee Hua Time MD responded:  [phone]

## 2011-09-21 NOTE — Progress Notes (Signed)
[facility] PROGRESS NOTE  Betty Fuentes ZOX:[id number] DOB: 01-27-1961 DOA: 09/20/2011 PCP: Reynolds Bowl, MD, MD  Assessment/Plan: 1. ARF - due to severe dehydration - continue iv fluids. F/u labs 2. C diff colitis - moderate - severe - no signs of toxic megacolon. Will change flagyl to vanco po  3. Buttock abscesses - necrotic - will culture - c/w empiric iv Vanc . Ask Gen surgery to consult - see if she needs debridement 4. Metabolic acidosis - anion gap is 15. Lactic acid 0.7 due to diarrhea - plan for iv bicarbonate and follow BMETs 5.   Principal Problem:  *Clostridium difficile colitis Active Problems:  ANXIETY  HYPERTENSION  Cellulitis and abscess of buttock  Renal failure, acute  Hypercalcemia  Leukocytosis  Metabolic acidosis  Code Status: full  Betty Iglesia, MD  [facility] Pager [phone]  If 7PM-7AM, please contact night-coverage [email or web address] Password [email or web address] 09/21/2011, 9:15 AM   LOS: 1 day   Brief narrative: 51 year old female who approximately one week ago started having some drainage from a boil in her right buttock perineal area that has progressively worsened over the last week and now has a significant open wound care and several other areas that have developed with some mild drainage. She's been having diarrhea she says for at least 2-3 months that has not been assessed. She denies any fevers, nausea, vomiting, abdominal pain. After admission she was found to have c diff colitis.     Consultants:  General Surgery   Procedures:  CT pelvis  Antibiotics:  Vancomycin iv 5/24-  Vancomycin po 5/25-  HPI/Subjective: Pain in the perineal area, diarrhea continues. No abdominal pain   Objective: Filed Vitals:   09/20/11 1841 09/21/11 0001 09/21/11 0019 09/21/11 0659  BP: 111/79 130/93 107/68 153/76  Pulse: 120 67 119 60  Temp: 98.2 F (36.8 C) 98.2 F (36.8 C) 97.6 F (36.4 C) 97.8 F (36.6 C)  TempSrc: Oral Oral Oral  Oral  Resp: 16 20 20 18   Height: 5\' 2"  (1.575 m)  5\' 2"  (1.575 m)   Weight: 64.411 kg (142 lb)  81.194 kg (179 lb)   SpO2: 100% 99% 98% 96%    Intake/Output Summary (Last 24 hours) at 09/21/11 0915 Last data filed at 09/21/11 0800  Gross per 24 hour  Intake 441.25 ml  Output    300 ml  Net 141.25 ml    Exam:   General:  Alert and oriented x3  Cardiovascular: RRR  Respiratory: CTAB  Abdomen: soft, NT  Large necrotic ulcer next to the rectum , less than 1 cm superficial abcess labia major right  Data Reviewed: Basic Metabolic Panel:  Lab 09/21/11 8295 09/20/11 1913  NA 136 135  K 4.1 4.8  CL 107 107  CO2 12* 11*  GLUCOSE 156* 209*  BUN 94* 98*  CREATININE 1.73* 2.21*  CALCIUM 10.8* 11.8*  MG -- --  PHOS -- --   Liver Function Tests:  Lab 09/20/11 1913  AST 13  ALT 7  ALKPHOS 143*  BILITOT 0.3  PROT 9.8*  ALBUMIN 4.3   No results found for this basename: LIPASE:5,AMYLASE:5 in the last 168 hours No results found for this basename: AMMONIA:5 in the last 168 hours CBC:  Lab 09/21/11 0633 09/20/11 1913  WBC 27.3* 30.6*  NEUTROABS -- 24.8*  HGB 12.2 13.7  HCT 38.1 42.1  MCV 87.0 87.5  PLT 325 399   Cardiac Enzymes: No results found for this basename: CKTOTAL:5,CKMB:5,CKMBINDEX:5,TROPONINI:5 in the last 168  hours BNP (last 3 results) No results found for this basename: PROBNP:3 in the last 8760 hours CBG: No results found for this basename: GLUCAP:5 in the last 168 hours  Recent Results (from the past 240 hour(s))  CLOSTRIDIUM DIFFICILE BY PCR     Status: Abnormal   Collection Time   09/21/11  1:08 AM      Component Value Range Status Comment   C difficile by pcr POSITIVE (*) NEGATIVE  Final      Studies: Ct Pelvis Wo Contrast  09/20/2011  *RADIOLOGY REPORT*  Clinical Data:  Blisters bilateral buttocks for 6 days.  Pain.  CT PELVIS WITHOUT CONTRAST  Technique:  Multidetector CT imaging of the pelvis was performed following the standard protocol  without intravenous contrast.  Comparison:  CT abdomen and pelvis 02/20/2009  Findings:  Soft tissue infiltration in the subcutaneous fat over the buttock regions bilaterally.  This may represent inflammation or edema.  Small soft tissue calcification on the right may represent injection granuloma.  No focal loculated fluid collection is demonstrated.  No evidence of soft tissue abscess.  Visualized pelvic organs appear intact.  The uterus and adnexal structures are not enlarged.  No inflammatory changes in the sigmoid colon.  The appendix is normal.  Visualized small and large bowel loops are not distended.  Degenerative changes in the lower lumbar spine.  IMPRESSION: Minimal infiltration in the subcutaneous fat over the buttocks.  No evidence of soft tissue abscess.  Original Report Authenticated By: Marlon Pel, M.D.    Scheduled Meds:    . sodium chloride  1,000 mL Intravenous Once  . sodium chloride  3 mL Intravenous Q12H  . vancomycin  125 mg Oral QID  . vancomycin  1,000 mg Intravenous Once  . vancomycin  1,000 mg Intravenous Q24H  . DISCONTD: sodium chloride   Intravenous Once  . DISCONTD: sodium chloride   Intravenous STAT  . DISCONTD: metroNIDAZOLE  500 mg Oral Q8H   Continuous Infusions:    .  sodium bicarbonate infusion 1/4 NS 1000 mL    . DISCONTD: 0.9 % NaCl with KCl 20 mEq / L 1,000 mL (09/21/11 0143)

## 2011-09-21 NOTE — Progress Notes (Signed)
Pt received Sitz bath at [facility]. Tolerated well.

## 2011-09-21 NOTE — Consult Note (Signed)
Reason for Consult: Buttock abscess Referring Physician: Hospitalist  Betty Fuentes is an 51 y.o. female.  HPI: Patient is a 36 white female who presents with dehydration and infection to to a gluteal abscess that she says has been present for approximately 3 weeks. She states she has been seen the emergency room. She gives a confusing history concerning this wound.  Past Medical History  Diagnosis Date  . Diabetes mellitus   . Hypertension   . High cholesterol   . Depression   . Anxiety     Past Surgical History  Procedure Date  . Facial reconstruction surgery   . Back surgery   . Cervical spine surgery   . Skin graft     History reviewed. No pertinent family history.  Social History:  reports that she has never smoked. She does not have any smokeless tobacco history on file. She reports that she does not drink alcohol or use illicit drugs.  Allergies: No Known Allergies  Medications: I have reviewed the patient's current medications.  Results for orders placed during the hospital encounter of 09/20/11 (from the past 48 hour(s))  CBC     Status: Abnormal   Collection Time   09/20/11  7:13 PM      Component Value Range Comment   WBC 30.6 (*) 4.0 - 10.5 (K/uL)    RBC 4.81  3.87 - 5.11 (MIL/uL)    Hemoglobin 13.7  12.0 - 15.0 (g/dL)    HCT 40.9  81.1 - 91.4 (%)    MCV 87.5  78.0 - 100.0 (fL)    MCH 28.5  26.0 - 34.0 (pg)    MCHC 32.5  30.0 - 36.0 (g/dL)    RDW 78.2  95.6 - 21.3 (%)    Platelets 399  150 - 400 (K/uL)   DIFFERENTIAL     Status: Abnormal   Collection Time   09/20/11  7:13 PM      Component Value Range Comment   Neutrophils Relative 81 (*) 43 - 77 (%)    Lymphocytes Relative 11 (*) 12 - 46 (%)    Monocytes Relative 7  3 - 12 (%)    Eosinophils Relative 1  0 - 5 (%)    Basophils Relative 0  0 - 1 (%)    Band Neutrophils 0  0 - 10 (%)    Metamyelocytes Relative 0      Myelocytes 0      Promyelocytes Absolute 0      Blasts 0      nRBC 0  0 (/100 WBC)     Neutro Abs 24.8 (*) 1.7 - 7.7 (K/uL)    Lymphs Abs 3.4  0.7 - 4.0 (K/uL)    Monocytes Absolute 2.1 (*) 0.1 - 1.0 (K/uL)    Eosinophils Absolute 0.3  0.0 - 0.7 (K/uL)    Basophils Absolute 0.0  0.0 - 0.1 (K/uL)   COMPREHENSIVE METABOLIC PANEL     Status: Abnormal   Collection Time   09/20/11  7:13 PM      Component Value Range Comment   Sodium 135  135 - 145 (mEq/L)    Potassium 4.8  3.5 - 5.1 (mEq/L)    Chloride 107  96 - 112 (mEq/L)    CO2 11 (*) 19 - 32 (mEq/L)    Glucose, Bld 209 (*) 70 - 99 (mg/dL)    BUN 98 (*) 6 - 23 (mg/dL)    Creatinine, Ser 0.86 (*) 0.50 - 1.10 (mg/dL)  Calcium 11.8 (*) 8.4 - 10.5 (mg/dL)    Total Protein 9.8 (*) 6.0 - 8.3 (g/dL)    Albumin 4.3  3.5 - 5.2 (g/dL)    AST 13  0 - 37 (U/L)    ALT 7  0 - 35 (U/L)    Alkaline Phosphatase 143 (*) 39 - 117 (U/L)    Total Bilirubin 0.3  0.3 - 1.2 (mg/dL)    GFR calc non Af Amer 24 (*) >90 (mL/min)    GFR calc Af Amer 28 (*) >90 (mL/min)   PROCALCITONIN     Status: Normal   Collection Time   09/20/11  7:13 PM      Component Value Range Comment   Procalcitonin 0.11     SEDIMENTATION RATE     Status: Abnormal   Collection Time   09/20/11  8:20 PM      Component Value Range Comment   Sed Rate 52 (*) 0 - 22 (mm/hr)   LACTIC ACID, PLASMA     Status: Normal   Collection Time   09/20/11  8:56 PM      Component Value Range Comment   Lactic Acid, Venous 0.7  0.5 - 2.2 (mmol/L)   CLOSTRIDIUM DIFFICILE BY PCR     Status: Abnormal   Collection Time   09/21/11  1:08 AM      Component Value Range Comment   C difficile by pcr POSITIVE (*) NEGATIVE    BASIC METABOLIC PANEL     Status: Abnormal   Collection Time   09/21/11  6:33 AM      Component Value Range Comment   Sodium 136  135 - 145 (mEq/L)    Potassium 4.1  3.5 - 5.1 (mEq/L)    Chloride 107  96 - 112 (mEq/L)    CO2 12 (*) 19 - 32 (mEq/L)    Glucose, Bld 156 (*) 70 - 99 (mg/dL)    BUN 94 (*) 6 - 23 (mg/dL)    Creatinine, Ser 1.61 (*) 0.50 - 1.10 (mg/dL)      Calcium 09.6 (*) 8.4 - 10.5 (mg/dL)    GFR calc non Af Amer 33 (*) >90 (mL/min)    GFR calc Af Amer 38 (*) >90 (mL/min)   CBC     Status: Abnormal   Collection Time   09/21/11  6:33 AM      Component Value Range Comment   WBC 27.3 (*) 4.0 - 10.5 (K/uL)    RBC 4.38  3.87 - 5.11 (MIL/uL)    Hemoglobin 12.2  12.0 - 15.0 (g/dL)    HCT 04.5  40.9 - 81.1 (%)    MCV 87.0  78.0 - 100.0 (fL)    MCH 27.9  26.0 - 34.0 (pg)    MCHC 32.0  30.0 - 36.0 (g/dL)    RDW 91.4  78.2 - 95.6 (%)    Platelets 325  150 - 400 (K/uL)     Ct Pelvis Wo Contrast  09/20/2011  *RADIOLOGY REPORT*  Clinical Data:  Blisters bilateral buttocks for 6 days.  Pain.  CT PELVIS WITHOUT CONTRAST  Technique:  Multidetector CT imaging of the pelvis was performed following the standard protocol without intravenous contrast.  Comparison:  CT abdomen and pelvis 02/20/2009  Findings:  Soft tissue infiltration in the subcutaneous fat over the buttock regions bilaterally.  This may represent inflammation or edema.  Small soft tissue calcification on the right may represent injection granuloma.  No focal loculated fluid collection is demonstrated.  No evidence of soft tissue abscess.  Visualized pelvic organs appear intact.  The uterus and adnexal structures are not enlarged.  No inflammatory changes in the sigmoid colon.  The appendix is normal.  Visualized small and large bowel loops are not distended.  Degenerative changes in the lower lumbar spine.  IMPRESSION: Minimal infiltration in the subcutaneous fat over the buttocks.  No evidence of soft tissue abscess.  Original Report Authenticated By: Marlon Pel, M.D.    ROS: See chart Blood pressure 105/72, pulse 113, temperature 97.7 F (36.5 C), temperature source Oral, resp. rate 20, height 5\' 2"  (1.575 m), weight 81.194 kg (179 lb), SpO2 97.00%. Physical Exam: [name] no acute distress. Perineal exam reveals an open large wound along the intergluteal fold,  extending up towards the vagina but not including it. No purulent drainage is noted.. It appears to be granulating in on its own. It was probed and no abscess cavity was identified. Surrounding erythema is noted.  Assessment/Plan: Impression: Perineal wound, no abscess cavity identified by physical exam or CT scan. She also has dehydration, renal insufficiency, and C. difficile positivity. I doubt at this point that the perineal wound is the only source for her above medical problems. No surgical intervention is warranted at this time. Have started sitz baths for local wound care. We'll follow with you.  Yajayra Feldt A 09/21/2011, 3:07 PM

## 2011-09-22 DIAGNOSIS — A0472 Enterocolitis due to Clostridium difficile, not specified as recurrent: Secondary | ICD-10-CM

## 2011-09-22 DIAGNOSIS — L0231 Cutaneous abscess of buttock: Secondary | ICD-10-CM

## 2011-09-22 DIAGNOSIS — L03317 Cellulitis of buttock: Secondary | ICD-10-CM

## 2011-09-22 DIAGNOSIS — R197 Diarrhea, unspecified: Secondary | ICD-10-CM

## 2011-09-22 DIAGNOSIS — N179 Acute kidney failure, unspecified: Secondary | ICD-10-CM

## 2011-09-22 LAB — BASIC METABOLIC PANEL
BUN: 47 mg/dL — ABNORMAL HIGH (ref 6–23)
Calcium: 9.9 mg/dL (ref 8.4–10.5)
Creatinine, Ser: 0.99 mg/dL (ref 0.50–1.10)
GFR calc non Af Amer: 64 mL/min — ABNORMAL LOW (ref >90)
Glucose, Bld: 166 mg/dL — ABNORMAL HIGH (ref 70–99)

## 2011-09-22 LAB — CBC
HCT: 33.3 % — ABNORMAL LOW (ref 36.0–46.0)
Hemoglobin: 11 g/dL — ABNORMAL LOW (ref 12.0–15.0)
MCH: 28.1 pg (ref 26.0–34.0)
MCHC: 33 g/dL (ref 30.0–36.0)
RDW: 14 % (ref 11.5–15.5)

## 2011-09-22 MED ORDER — STERILE WATER FOR INJECTION IV SOLN
INTRAVENOUS | Status: AC
  Filled 2011-09-22 (×2): qty 9.7

## 2011-09-22 MED ORDER — METRONIDAZOLE IN NACL 5-0.79 MG/ML-% IV SOLN
500.0000 mg | Freq: Three times a day (TID) | INTRAVENOUS | Status: DC
  Administered 2011-09-22 – 2011-09-24 (×6): 500 mg via INTRAVENOUS
  Filled 2011-09-22 (×8): qty 100

## 2011-09-22 NOTE — Progress Notes (Signed)
  Subjective: Subjectively feels better than yesterday.  Objective: Vital signs in last 24 hours: Temp:  [97.7 F (36.5 C)-99 F (37.2 C)] 99 F (37.2 C) (05/26 0547) Pulse Rate:  [89-113] 109  (05/26 0547) Resp:  [20] 20  (05/26 0547) BP: (93-105)/(68-72) 102/69 mmHg (05/26 0547) SpO2:  [96 %-97 %] 96 % (05/26 0547) Last BM Date: 09/21/11  Intake/Output from previous day: 05/25 0701 - 05/26 0700 In: 1618.8 [P.O.:360; I.V.:1258.8] Out: 1525 [Urine:1525] Intake/Output this shift: Total I/O In: 240 [P.O.:240] Out: -   General appearance: alert, cooperative and no distress  Lab Results:   Basename 09/22/11 0740 09/21/11 0633  WBC 16.4* 27.3*  HGB 11.0* 12.2  HCT 33.3* 38.1  PLT 289 325   BMET  Basename 09/22/11 0740 09/21/11 0633  NA 137 136  K 3.3* 4.1  CL 107 107  CO2 17* 12*  GLUCOSE 166* 156*  BUN 47* 94*  CREATININE 0.99 1.73*  CALCIUM 9.9 10.8*   PT/INR No results found for this basename: LABPROT:2,INR:2 in the last 72 hours  Studies/Results: Ct Pelvis Wo Contrast  09/20/2011  *RADIOLOGY REPORT*  Clinical Data:  Blisters bilateral buttocks for 6 days.  Pain.  CT PELVIS WITHOUT CONTRAST  Technique:  Multidetector CT imaging of the pelvis was performed following the standard protocol without intravenous contrast.  Comparison:  CT abdomen and pelvis 02/20/2009  Findings:  Soft tissue infiltration in the subcutaneous fat over the buttock regions bilaterally.  This may represent inflammation or edema.  Small soft tissue calcification on the right may represent injection granuloma.  No focal loculated fluid collection is demonstrated.  No evidence of soft tissue abscess.  Visualized pelvic organs appear intact.  The uterus and adnexal structures are not enlarged.  No inflammatory changes in the sigmoid colon.  The appendix is normal.  Visualized small and large bowel loops are not distended.  Degenerative changes in the lower lumbar spine.  IMPRESSION: Minimal  infiltration in the subcutaneous fat over the buttocks.  No evidence of soft tissue abscess.  Original Report Authenticated By: Marlon Pel, M.D.    Anti-infectives: Anti-infectives     Start     Dose/Rate Route Frequency Ordered Stop   09/22/11 1000   metroNIDAZOLE (FLAGYL) IVPB 500 mg        500 mg 100 mL/hr over 60 Minutes Intravenous Every 8 hours 09/22/11 0855     09/21/11 1600   vancomycin (VANCOCIN) IVPB 1000 mg/200 mL premix        1,000 mg 200 mL/hr over 60 Minutes Intravenous Every 24 hours 09/21/11 0911     09/21/11 1115   vancomycin (VANCOCIN) 50 mg/mL oral solution 125 mg        125 mg Oral 4 times daily 09/21/11 0914 10/05/11 0959   09/21/11 0600   metroNIDAZOLE (FLAGYL) tablet 500 mg  Status:  Discontinued        500 mg Oral 3 times per day 09/21/11 0346 09/21/11 0914   09/20/11 2045   vancomycin (VANCOCIN) IVPB 1000 mg/200 mL premix        1,000 mg 200 mL/hr over 60 Minutes Intravenous  Once 09/20/11 2034 09/20/11 2158          Assessment/Plan: Impression: Peroneal wound healing well. Leukocytosis is improving. Renal insufficiency is resolving. Cultures are still pending. Would continue local wound care at this point. Would continue IV antibiotics as ordered.  LOS: 2 days    Laurelle Skiver A 09/22/2011

## 2011-09-22 NOTE — Progress Notes (Signed)
Pt received Sitz Bath at 1415 per Dr. Lovell Sheehan' orders. Pt tolerated well.

## 2011-09-22 NOTE — Progress Notes (Signed)
[facility]                                                                                 Patient Demographics  Betty Fuentes, is a 51 y.o. female  ZOX:[id number]  [id number]  DOB - [birth year]  Admit date - 09/20/2011  Admitting Physician Haydee Monica, MD  Outpatient Primary MD for the patient is Reynolds Bowl, MD, MD  LOS - 2     Chief Complaint  Patient presents with  . Abscess        Subjective:   Betty Fuentes today has, No headache, No chest pain, No abdominal pain - No Nausea, No new weakness tingling or numbness, No Cough - SOB. Her diarrhea better she has no pain.  Objective:   Filed Vitals:   09/21/11 0659 09/21/11 1426 09/21/11 2136 09/22/11 0547  BP: 153/76 105/72 93/68 102/69  Pulse: 60 113 89 109  Temp: 97.8 F (36.6 C) 97.7 F (36.5 C) 98.4 F (36.9 C) 99 F (37.2 C)  TempSrc: Oral  Oral Oral  Resp: 18 20 20 20   Height:      Weight:      SpO2: 96% 97% 97% 96%    Wt Readings from Last 3 Encounters:  09/21/11 81.194 kg (179 lb)  12/02/06 83.915 kg (185 lb)     Intake/Output Summary (Last 24 hours) at 09/22/11 0855 Last data filed at 09/22/11 0600  Gross per 24 hour  Intake 1498.75 ml  Output   1525 ml  Net -26.25 ml    Exam Awake Alert, Oriented *3, No new F.N deficits, Normal affect [place].AT,PERRAL Supple Neck,No JVD, No cervical lymphadenopathy appriciated.  Symmetrical Chest wall movement, Good air movement bilaterally, CTAB RRR,No Gallops,Rubs or new Murmurs, No Parasternal Heave +ve B.Sounds, Abd Soft, Non tender, No organomegaly appriciated, No rebound -guarding or rigidity. No Cyanosis, Clubbing or edema, No new Rash or bruise, left gluteal abscess appears stable with no signs of fluctuance.  Data Review  CBC  Lab 09/22/11 0740 09/21/11 0633 09/20/11 1913  WBC 16.4* 27.3* 30.6*  HGB 11.0* 12.2 13.7  HCT 33.3* 38.1 42.1  PLT 289 325 399  MCV 85.2 87.0 87.5  MCH 28.1 27.9 28.5  MCHC 33.0 32.0  32.5  RDW 14.0 14.3 14.5  LYMPHSABS -- -- 3.4  MONOABS -- -- 2.1*  EOSABS -- -- 0.3  BASOSABS -- -- 0.0  BANDABS -- -- --    Chemistries   Lab 09/22/11 0740 09/21/11 0633 09/20/11 1913  NA 137 136 135  K 3.3* 4.1 4.8  CL 107 107 107  CO2 17* 12* 11*  GLUCOSE 166* 156* 209*  BUN 47* 94* 98*  CREATININE 0.99 1.73* 2.21*  CALCIUM 9.9 10.8* 11.8*  MG -- -- --  AST -- -- 13  ALT -- -- 7  ALKPHOS -- -- 143*  BILITOT -- -- 0.3   ------------------------------------------------------------------------------------------------------------------ estimated creatinine clearance is 65.6 ml/min (by C-G formula based on Cr of 0.99). ------------------------------------------------------------------------------------------------------------------ No results found for this basename: HGBA1C:2 in the last 72 hours ------------------------------------------------------------------------------------------------------------------ No results found for this basename: CHOL:2,HDL:2,LDLCALC:2,TRIG:2,CHOLHDL:2,LDLDIRECT:2 in the last 72 hours ------------------------------------------------------------------------------------------------------------------ No results found for this basename: TSH,T4TOTAL,FREET3,T3FREE,THYROIDAB  in the last 72 hours ------------------------------------------------------------------------------------------------------------------ No results found for this basename: VITAMINB12:2,FOLATE:2,FERRITIN:2,TIBC:2,IRON:2,RETICCTPCT:2 in the last 72 hours  Coagulation profile No results found for this basename: INR:5,PROTIME:5 in the last 168 hours  No results found for this basename: DDIMER:2 in the last 72 hours  Cardiac Enzymes No results found for this basename: CK:3,CKMB:3,TROPONINI:3,MYOGLOBIN:3 in the last 168 hours ------------------------------------------------------------------------------------------------------------------ No components found with this basename:  POCBNP:3  Micro Results Recent Results (from the past 240 hour(s))  CLOSTRIDIUM DIFFICILE BY PCR     Status: Abnormal   Collection Time   09/21/11  1:08 AM      Component Value Range Status Comment   C difficile by pcr POSITIVE (*) NEGATIVE  Final   WOUND CULTURE     Status: Normal (Preliminary result)   Collection Time   09/21/11 12:46 PM      Component Value Range Status Comment   Specimen Description VULVA   Final    Special Requests Normal   Final    Gram Stain PENDING   Incomplete    Culture Culture reincubated for better growth   Final    Report Status PENDING   Incomplete     Radiology Reports Ct Pelvis Wo Contrast  09/20/2011  *RADIOLOGY REPORT*  Clinical Data:  Blisters bilateral buttocks for 6 days.  Pain.  CT PELVIS WITHOUT CONTRAST  Technique:  Multidetector CT imaging of the pelvis was performed following the standard protocol without intravenous contrast.  Comparison:  CT abdomen and pelvis 02/20/2009  Findings:  Soft tissue infiltration in the subcutaneous fat over the buttock regions bilaterally.  This may represent inflammation or edema.  Small soft tissue calcification on the right may represent injection granuloma.  No focal loculated fluid collection is demonstrated.  No evidence of soft tissue abscess.  Visualized pelvic organs appear intact.  The uterus and adnexal structures are not enlarged.  No inflammatory changes in the sigmoid colon.  The appendix is normal.  Visualized small and large bowel loops are not distended.  Degenerative changes in the lower lumbar spine.  IMPRESSION: Minimal infiltration in the subcutaneous fat over the buttocks.  No evidence of soft tissue abscess.  Original Report Authenticated By: Marlon Pel, M.D.    Scheduled Meds:   . amitriptyline  50 mg Oral QHS  . citalopram  40 mg Oral Daily  . metoprolol  50 mg Oral BID  . metronidazole  500 mg Intravenous Q8H  . simvastatin  40 mg Oral q1800  . sodium chloride  3 mL Intravenous  Q12H  . vancomycin  125 mg Oral QID  . vancomycin  1,000 mg Intravenous Q24H  . DISCONTD: metroNIDAZOLE  500 mg Oral Q8H   Continuous Infusions:   .  sodium bicarbonate infusion 1/4 NS 1000 mL    . DISCONTD: 0.9 % NaCl with KCl 20 mEq / L 1,000 mL (09/21/11 0143)  . DISCONTD:  sodium bicarbonate infusion 1/4 NS 1000 mL 75 mL/hr at 09/21/11 1313   PRN Meds:.clonazePAM, HYDROcodone-acetaminophen  Assessment & Plan   1. Clostridium difficile colitis- improving clinically, patient's diarrhea is better she has no abdominal pain, no nausea, her leukocytosis is better, agree with by mouth vancomycin, and IV Flagyl and continue to monitor CBC daily.   2. Diarrhea induced severe metabolic acidosis with low bicarbonate- agree with IV bicarbonate replacement, bicarbonate levels are improving, we'll continue to monitor.   3. Recent gluteal cellulitis- on IV vancomycin and Flagyl, surgery is following, no area of abscess noted, appreciate  surgery input we'll continue to monitor on antibiotics patient who is getting seeds bath and local wound care per surgery.   4. Dehydration induced acute renal failure- IV fluids to continue, renal failure improving and almost resolved, serial BMP.    DVT Prophylaxis  SCDs     Leroy Sea M.D on 09/22/2011 at 8:55 AM     [facility] Office  [phone]

## 2011-09-23 DIAGNOSIS — D649 Anemia, unspecified: Secondary | ICD-10-CM | POA: Diagnosis present

## 2011-09-23 DIAGNOSIS — E139 Other specified diabetes mellitus without complications: Secondary | ICD-10-CM | POA: Diagnosis present

## 2011-09-23 DIAGNOSIS — E876 Hypokalemia: Secondary | ICD-10-CM | POA: Diagnosis not present

## 2011-09-23 DIAGNOSIS — L0231 Cutaneous abscess of buttock: Secondary | ICD-10-CM

## 2011-09-23 DIAGNOSIS — A0472 Enterocolitis due to Clostridium difficile, not specified as recurrent: Secondary | ICD-10-CM

## 2011-09-23 DIAGNOSIS — L03317 Cellulitis of buttock: Secondary | ICD-10-CM

## 2011-09-23 DIAGNOSIS — R112 Nausea with vomiting, unspecified: Secondary | ICD-10-CM

## 2011-09-23 LAB — CBC
MCH: 28.1 pg (ref 26.0–34.0)
MCHC: 33.1 g/dL (ref 30.0–36.0)
Platelets: 277 10*3/uL (ref 150–400)
RDW: 13.5 % (ref 11.5–15.5)

## 2011-09-23 LAB — BASIC METABOLIC PANEL
Calcium: 9.3 mg/dL (ref 8.4–10.5)
GFR calc Af Amer: >90 mL/min (ref >90)
GFR calc non Af Amer: 81 mL/min — ABNORMAL LOW (ref >90)
Glucose, Bld: 138 mg/dL — ABNORMAL HIGH (ref 70–99)
Sodium: 138 mEq/L (ref 135–145)

## 2011-09-23 LAB — GLUCOSE, CAPILLARY
Glucose-Capillary: 227 mg/dL — ABNORMAL HIGH (ref 70–99)
Glucose-Capillary: 155 mg/dL — ABNORMAL HIGH (ref 70–99)

## 2011-09-23 LAB — VANCOMYCIN, TROUGH: Vancomycin Tr: 10.7 ug/mL (ref 10.0–20.0)

## 2011-09-23 MED ORDER — INSULIN ASPART 100 UNIT/ML ~~LOC~~ SOLN: 0.0000 [IU] | Freq: Every day | SUBCUTANEOUS | Status: DC

## 2011-09-23 MED ORDER — INSULIN ASPART 100 UNIT/ML ~~LOC~~ SOLN
0.0000 [IU] | Freq: Three times a day (TID) | SUBCUTANEOUS | Status: DC
  Administered 2011-09-23: 3 [IU] via SUBCUTANEOUS
  Administered 2011-09-24: 2 [IU] via SUBCUTANEOUS
  Administered 2011-09-24: 1 [IU] via SUBCUTANEOUS

## 2011-09-23 MED ORDER — SODIUM CHLORIDE 0.9 % IJ SOLN
INTRAMUSCULAR | Status: AC
  Administered 2011-09-23: 3 mL
  Filled 2011-09-23: qty 3

## 2011-09-23 MED ORDER — POTASSIUM CHLORIDE CRYS ER 20 MEQ PO TBCR
40.0000 meq | EXTENDED_RELEASE_TABLET | Freq: Two times a day (BID) | ORAL | Status: DC
  Administered 2011-09-23 – 2011-09-24 (×3): 40 meq via ORAL
  Filled 2011-09-23 (×3): qty 2

## 2011-09-23 MED ORDER — SODIUM CHLORIDE 0.9 % IJ SOLN
INTRAMUSCULAR | Status: AC
  Administered 2011-09-23: 10 mL
  Filled 2011-09-23: qty 3

## 2011-09-23 MED ORDER — METOPROLOL TARTRATE 25 MG PO TABS
12.5000 mg | ORAL_TABLET | Freq: Two times a day (BID) | ORAL | Status: DC
  Administered 2011-09-23 – 2011-09-24 (×2): 12.5 mg via ORAL
  Filled 2011-09-23 (×2): qty 1

## 2011-09-23 MED ORDER — SODIUM CHLORIDE 0.45 % IV SOLN
INTRAVENOUS | Status: DC
  Administered 2011-09-23: 12:00:00 via INTRAVENOUS
  Filled 2011-09-23 (×4): qty 1000

## 2011-09-23 NOTE — Progress Notes (Signed)
INITIAL ADULT NUTRITION ASSESSMENT Date: 09/23/2011   Time: 12:19 PM Reason for Assessment: Nutrition Risk Screen- wt loss  ASSESSMENT: Female 51 y.o.  Dx: Clostridium difficile colitis   Past Medical History  Diagnosis Date  . Diabetes mellitus   . Hypertension   . High cholesterol   . Depression   . Anxiety      Scheduled Meds:   . amitriptyline  50 mg Oral QHS  . citalopram  40 mg Oral Daily  . metoprolol  50 mg Oral BID  . metronidazole  500 mg Intravenous Q8H  . potassium chloride  40 mEq Oral BID  . simvastatin  40 mg Oral q1800  . sodium chloride  3 mL Intravenous Q12H  . sodium chloride      . sodium chloride      . vancomycin  125 mg Oral QID  . vancomycin  1,000 mg Intravenous Q24H   Continuous Infusions:   .  sodium bicarbonate infusion 1/4 NS 1000 mL    . sodium chloride 0.45 % 1,000 mL with potassium chloride 40 mEq, sodium bicarbonate 50 mEq infusion     PRN Meds:.clonazePAM, HYDROcodone-acetaminophen  Ht: 5\' 2"  (157.5 cm)  Wt: 179 lb (81.194 kg)  Ideal Wt: 50.1 kg  % Ideal Wt: 162%  Usual Wt: 195# % Usual Wt: 92%  Body mass index is 32.74 kg/(m^2). Obesity Grade I  Food/Nutrition Related Hx: Pt sitting on side of bed eating lunch. Reports good appetite.  Wt loss noted. Wound healing well per MD note.  CMP     Component Value Date/Time   NA 138 09/23/2011 0538   K 2.8* 09/23/2011 0538   CL 105 09/23/2011 0538   CO2 23 09/23/2011 0538   GLUCOSE 138* 09/23/2011 0538   BUN 31* 09/23/2011 0538   CREATININE 0.82 09/23/2011 0538   CALCIUM 9.3 09/23/2011 0538   PROT 9.8* 09/20/2011 1913   ALBUMIN 4.3 09/20/2011 1913   AST 13 09/20/2011 1913   ALT 7 09/20/2011 1913   ALKPHOS 143* 09/20/2011 1913   BILITOT 0.3 09/20/2011 1913   GFRNONAA 81* 09/23/2011 0538   GFRAA >90 09/23/2011 0538      Diet Order: Cardiac po intake 75% meals  Supplements/Tube Feeding:none at time time  IVF:    sodium bicarbonate infusion 1/4 NS 1000 mL  sodium chloride 0.45  % 1,000 mL with potassium chloride 40 mEq, sodium bicarbonate 50 mEq infusion    Estimated Nutritional Needs:   Kcal:1500-1700 kcal per day Protein:75-85 gr per day Fluid:1 ml per kcal  NUTRITION DIAGNOSIS: None at this time  MONITORING/EVALUATION(Goals): -Monitor meal intake,  wt trends  EDUCATION NEEDS: -Education needs addressed  INTERVENTION: -RD to follow for nutrition needs  Dietitian [phone]  DOCUMENTATION CODES Per approved criteria  -Not Applicable    Betty Fuentes 09/23/2011, 12:19 PM

## 2011-09-23 NOTE — Progress Notes (Signed)
SITZ BATH COMPLETED.

## 2011-09-23 NOTE — Progress Notes (Signed)
  Subjective: No complaints.  Objective: Vital signs in last 24 hours: Temp:  [98.1 F (36.7 C)-98.7 F (37.1 C)] 98.1 F (36.7 C) (05/27 0627) Pulse Rate:  [78-86] 81  (05/27 0627) Resp:  [20] 20  (05/27 0627) BP: (92-110)/(53-78) 92/53 mmHg (05/27 0627) SpO2:  [95 %-96 %] 96 % (05/27 0627) Last BM Date: 09/22/11  Intake/Output from previous day: 05/26 0701 - 05/27 0700 In: 1675 [P.O.:600; I.V.:675; IV Piggyback:400] Out: 750 [Urine:750] Intake/Output this shift:    General appearance: alert, cooperative and no distress Right perineal wound healing well by secondary intention. No abscess cavity present. Granulation tissue present.  Lab Results:   Basename 09/23/11 0538 09/22/11 0740  WBC 11.2* 16.4*  HGB 9.7* 11.0*  HCT 29.3* 33.3*  PLT 277 289   BMET  Basename 09/23/11 0538 09/22/11 0740  NA 138 137  K 2.8* 3.3*  CL 105 107  CO2 23 17*  GLUCOSE 138* 166*  BUN 31* 47*  CREATININE 0.82 0.99  CALCIUM 9.3 9.9   PT/INR No results found for this basename: LABPROT:2,INR:2 in the last 72 hours  Studies/Results: No results found.  Anti-infectives: Anti-infectives     Start     Dose/Rate Route Frequency Ordered Stop   09/22/11 1000   metroNIDAZOLE (FLAGYL) IVPB 500 mg        500 mg 100 mL/hr over 60 Minutes Intravenous Every 8 hours 09/22/11 0855     09/21/11 1600   vancomycin (VANCOCIN) IVPB 1000 mg/200 mL premix        1,000 mg 200 mL/hr over 60 Minutes Intravenous Every 24 hours 09/21/11 0911     09/21/11 1115   vancomycin (VANCOCIN) 50 mg/mL oral solution 125 mg        125 mg Oral 4 times daily 09/21/11 0914 10/05/11 0959   09/21/11 0600   metroNIDAZOLE (FLAGYL) tablet 500 mg  Status:  Discontinued        500 mg Oral 3 times per day 09/21/11 0346 09/21/11 0914   09/20/11 2045   vancomycin (VANCOCIN) IVPB 1000 mg/200 mL premix        1,000 mg 200 mL/hr over 60 Minutes Intravenous  Once 09/20/11 2034 09/20/11 2158           Assessment/Plan: Impression: Perineal wound healing well. This will take some time to heal and will be treated with sitz baths and packing as an outpatient. No acute surgical intervention warranted at this time. Overall, her clinical situation is improving. We'll continue to follow with you. Final wound culture results pending, though staph aureus is seen on Gram stain.  LOS: 3 days    Obert Espindola A 09/23/2011

## 2011-09-23 NOTE — Progress Notes (Signed)
Subjective: The patient has had 2 loose stools overnight. Less abdominal pain. Less gluteal/perineal  pain.  Objective: Vital signs in last 24 hours: Filed Vitals:   09/22/11 0547 09/22/11 1528 09/22/11 2100 09/23/11 0627  BP: 102/69 95/65 110/78 92/53  Pulse: 109 86 78 81  Temp: 99 F (37.2 C) 98.7 F (37.1 C) 98.5 F (36.9 C) 98.1 F (36.7 C)  TempSrc: Oral  Oral Oral  Resp: 20 20 20 20   Height:      Weight:      SpO2: 96% 95% 96% 96%    Intake/Output Summary (Last 24 hours) at 09/23/11 1245 Last data filed at 09/23/11 0845  Gross per 24 hour  Intake   1335 ml  Output    250 ml  Net   1085 ml    Weight change:   Physical exam: Lungs: Clear to auscultation bilaterally. Heart: S1, S2, with no murmurs rubs gallops. Abdomen: Positive bowel sounds, soft, mildly tender in the hypogastrium. No rebound or rigidity. Perineum/gluteal area. Clean wound with no active drainage. Extremities: No pedal edema.  Lab Results: Basic Metabolic Panel:  Basename 09/23/11 0538 09/22/11 0740  NA 138 137  K 2.8* 3.3*  CL 105 107  CO2 23 17*  GLUCOSE 138* 166*  BUN 31* 47*  CREATININE 0.82 0.99  CALCIUM 9.3 9.9  MG -- --  PHOS -- --   Liver Function Tests:  [facility] 09/20/11 1913  AST 13  ALT 7  ALKPHOS 143*  BILITOT 0.3  PROT 9.8*  ALBUMIN 4.3   No results found for this basename: LIPASE:2,AMYLASE:2 in the last 72 hours No results found for this basename: AMMONIA:2 in the last 72 hours CBC:  Basename 09/23/11 0538 09/22/11 0740 09/20/11 1913  WBC 11.2* 16.4* --  NEUTROABS -- -- 24.8*  HGB 9.7* 11.0* --  HCT 29.3* 33.3* --  MCV 84.9 85.2 --  PLT 277 289 --   Cardiac Enzymes: No results found for this basename: CKTOTAL:3,CKMB:3,CKMBINDEX:3,TROPONINI:3 in the last 72 hours BNP: No results found for this basename: PROBNP:3 in the last 72 hours D-Dimer: No results found for this basename: DDIMER:2 in the last 72 hours CBG: No results found for this basename:  GLUCAP:6 in the last 72 hours Hemoglobin A1C: No results found for this basename: HGBA1C in the last 72 hours Fasting Lipid Panel: No results found for this basename: CHOL,HDL,LDLCALC,TRIG,CHOLHDL,LDLDIRECT in the last 72 hours Thyroid Function Tests: No results found for this basename: TSH,T4TOTAL,FREET4,T3FREE,THYROIDAB in the last 72 hours Anemia Panel: No results found for this basename: VITAMINB12,FOLATE,FERRITIN,TIBC,IRON,RETICCTPCT in the last 72 hours Coagulation: No results found for this basename: LABPROT:2,INR:2 in the last 72 hours Urine Drug Screen: Drugs of Abuse  No results found for this basename: labopia, cocainscrnur, labbenz, amphetmu, thcu, labbarb    Alcohol Level: No results found for this basename: ETH:2 in the last 72 hours Urinalysis: No results found for this basename: COLORURINE:2,APPERANCEUR:2,LABSPEC:2,PHURINE:2,GLUCOSEU:2,HGBUR:2,BILIRUBINUR:2,KETONESUR:2,PROTEINUR:2,UROBILINOGEN:2,NITRITE:2,LEUKOCYTESUR:2 in the last 72 hours Misc. Labs:   Micro: Recent Results (from the past 240 hour(s))  STOOL CULTURE     Status: Normal (Preliminary result)   Collection Time   09/21/11  1:06 AM      Component Value Range Status Comment   Specimen Description STOOL   Final    Special Requests NONE   Final    Culture NO SUSPICIOUS COLONIES, CONTINUING TO HOLD   Final    Report Status PENDING   Incomplete   CLOSTRIDIUM DIFFICILE BY PCR     Status: Abnormal  Collection Time   09/21/11  1:08 AM      Component Value Range Status Comment   C difficile by pcr POSITIVE (*) NEGATIVE  Final   WOUND CULTURE     Status: Normal (Preliminary result)   Collection Time   09/21/11 12:46 PM      Component Value Range Status Comment   Specimen Description VULVA   Final    Special Requests Normal   Final    Gram Stain PENDING   Incomplete    Culture     Final    Value: ABUNDANT STAPHYLOCOCCUS AUREUS     Note: RIFAMPIN AND GENTAMICIN SHOULD NOT BE USED AS SINGLE DRUGS FOR  TREATMENT OF STAPH INFECTIONS.   Report Status PENDING   Incomplete     Studies/Results: No results found.  Medications:  Scheduled:   . amitriptyline  50 mg Oral QHS  . citalopram  40 mg Oral Daily  . metoprolol  50 mg Oral BID  . metronidazole  500 mg Intravenous Q8H  . potassium chloride  40 mEq Oral BID  . simvastatin  40 mg Oral q1800  . sodium chloride  3 mL Intravenous Q12H  . sodium chloride      . sodium chloride      . vancomycin  125 mg Oral QID  . vancomycin  1,000 mg Intravenous Q24H   Continuous:   .  sodium bicarbonate infusion 1/4 NS 1000 mL    . sodium chloride 0.45 % 1,000 mL with potassium chloride 40 mEq, sodium bicarbonate 50 mEq infusion     ZOX:[id number], HYDROcodone-acetaminophen  Assessment: Principal Problem:  *Clostridium difficile colitis Active Problems:  ANXIETY  HYPERTENSION  Cellulitis and abscess of buttock  Renal failure, acute  Hypercalcemia  Leukocytosis  Metabolic acidosis  Diabetes mellitus  Hypokalemia  Anemia   1. Perineal/buttock abscess secondary to staph aureus. She is on vancomycin and Flagyl IV. Dr. Lovell Sheehan is following. He does not recommend surgical intervention as it does not appear to be needed.Marland Kitchen He recommends continued dressing changes since his baths following discharge as an outpatient.  C. difficile colitis. We'll continue oral vancomycin.  Type 2 diabetes mellitus. This is chronically diet controlled. Will assess her hemoglobin A1c and start sliding scale NovoLog.  Anemia. Her anemia, is in part, secondary to hemodilution. We'll check an anemia panel.  Metabolic acidosis secondary to dehydration, acute renal failure, and diarrhea. Resolving on gentle bicarbonate.  Hypokalemia. This is secondary to diarrhea and IV fluids without potassium.  Acute renal failure. Resolved.  Hypertension. Stable on metoprolol, but will decrease the dose as her blood pressure is decreasing.  Plan:   1We'll add sliding  scale NovoLog. We'll order hemoglobin A1c. 2. Will replete her potassium orally and in the IV fluids. We'll check a magnesium level in the morning. 3. We'll decrease the metoprolol to 12.5 mg twice a day and place holding parameters for hypotension.   LOS: 3 days   Amran Malter 09/23/2011, 12:45 PM

## 2011-09-24 DIAGNOSIS — L03317 Cellulitis of buttock: Secondary | ICD-10-CM

## 2011-09-24 DIAGNOSIS — A0472 Enterocolitis due to Clostridium difficile, not specified as recurrent: Secondary | ICD-10-CM

## 2011-09-24 DIAGNOSIS — R112 Nausea with vomiting, unspecified: Secondary | ICD-10-CM

## 2011-09-24 DIAGNOSIS — E876 Hypokalemia: Secondary | ICD-10-CM

## 2011-09-24 DIAGNOSIS — L0231 Cutaneous abscess of buttock: Secondary | ICD-10-CM

## 2011-09-24 LAB — FOLATE: Folate: 9.5 ng/mL

## 2011-09-24 LAB — BASIC METABOLIC PANEL
CO2: 25 mEq/L (ref 19–32)
Calcium: 9.5 mg/dL (ref 8.4–10.5)
GFR calc non Af Amer: >90 mL/min (ref >90)
Glucose, Bld: 146 mg/dL — ABNORMAL HIGH (ref 70–99)
Potassium: 3.6 mEq/L (ref 3.5–5.1)
Sodium: 139 mEq/L (ref 135–145)

## 2011-09-24 LAB — GLUCOSE, CAPILLARY: Glucose-Capillary: 176 mg/dL — ABNORMAL HIGH (ref 70–99)

## 2011-09-24 LAB — HEMOGLOBIN A1C: Mean Plasma Glucose: 169 mg/dL — ABNORMAL HIGH (ref <117)

## 2011-09-24 LAB — CBC
Hemoglobin: 9.5 g/dL — ABNORMAL LOW (ref 12.0–15.0)
MCH: 28.2 pg (ref 26.0–34.0)
Platelets: 296 10*3/uL (ref 150–400)
RBC: 3.37 MIL/uL — ABNORMAL LOW (ref 3.87–5.11)

## 2011-09-24 LAB — WOUND CULTURE

## 2011-09-24 LAB — VITAMIN B12: Vitamin B-12: 941 pg/mL — ABNORMAL HIGH (ref 211–911)

## 2011-09-24 LAB — IRON AND TIBC: Iron: 61 ug/dL (ref 42–135)

## 2011-09-24 MED ORDER — POTASSIUM CHLORIDE CRYS ER 20 MEQ PO TBCR: 20.0000 meq | EXTENDED_RELEASE_TABLET | Freq: Two times a day (BID) | ORAL | Status: DC

## 2011-09-24 MED ORDER — MAGNESIUM SULFATE 50 % IJ SOLN: 1.0000 g | Freq: Once | INTRAMUSCULAR | Status: DC

## 2011-09-24 MED ORDER — METRONIDAZOLE 500 MG PO TABS: 500.0000 mg | ORAL_TABLET | Freq: Three times a day (TID) | ORAL | Status: DC

## 2011-09-24 MED ORDER — MAGNESIUM SULFATE IN D5W 10-5 MG/ML-% IV SOLN
1.0000 g | Freq: Once | INTRAVENOUS | Status: AC
Start: 2011-09-24 — End: 2011-09-24
  Administered 2011-09-24: 1 g via INTRAVENOUS
  Filled 2011-09-24: qty 100

## 2011-09-24 MED ORDER — METRONIDAZOLE 500 MG PO TABS: 500.0000 mg | ORAL_TABLET | Freq: Three times a day (TID) | ORAL | Status: AC

## 2011-09-24 MED ORDER — GLIPIZIDE 5 MG PO TABS: ORAL_TABLET | ORAL | Status: DC

## 2011-09-24 NOTE — Consult Note (Signed)
ANTIBIOTIC CONSULT NOTE  Pharmacy Consult for Vancomycin Indication: cellulitis of buttock  No Known Allergies  Patient Measurements: Height: 5\' 2"  (157.5 cm) Weight: 179 lb (81.194 kg) IBW/kg (Calculated) : 50.1   Vital Signs: Temp: 98.7 F (37.1 C) (05/28 0542) Temp src: Oral (05/28 0542) BP: 98/64 mmHg (05/28 0542) Pulse Rate: 85  (05/28 0542) Intake/Output from previous day: 05/27 0701 - 05/28 0700 In: 2242.5 [P.O.:600; I.V.:1242.5; IV Piggyback:400] Out: -  Intake/Output from this shift:    Labs:  Basename 09/24/11 0615 09/23/11 0538 09/22/11 0740  WBC 10.3 11.2* 16.4*  HGB 9.5* 9.7* 11.0*  PLT 296 277 289  LABCREA -- -- --  CREATININE 0.74 0.82 0.99   Estimated Creatinine Clearance: 81.2 ml/min (by C-G formula based on Cr of 0.74).  Basename 09/23/11 1507  VANCOTROUGH 10.7  VANCOPEAK --  VANCORANDOM --  GENTTROUGH --  GENTPEAK --  GENTRANDOM --  TOBRATROUGH --  TOBRAPEAK --  TOBRARND --  AMIKACINPEAK --  AMIKACINTROU --  AMIKACIN --     Microbiology: Recent Results (from the past 720 hour(s))  STOOL CULTURE     Status: Normal (Preliminary result)   Collection Time   09/21/11  1:06 AM      Component Value Range Status Comment   Specimen Description STOOL   Final    Special Requests NONE   Final    Culture NO SUSPICIOUS COLONIES, CONTINUING TO HOLD   Final    Report Status PENDING   Incomplete   CLOSTRIDIUM DIFFICILE BY PCR     Status: Abnormal   Collection Time   09/21/11  1:08 AM      Component Value Range Status Comment   C difficile by pcr POSITIVE (*) NEGATIVE  Final   WOUND CULTURE     Status: Normal (Preliminary result)   Collection Time   09/21/11 12:46 PM      Component Value Range Status Comment   Specimen Description VULVA   Final    Special Requests Normal   Final    Gram Stain     Final    Value: RARE WBC PRESENT, PREDOMINANTLY PMN     RARE SQUAMOUS EPITHELIAL CELLS PRESENT     MODERATE GRAM POSITIVE COCCI     IN CLUSTERS   Culture     Final    Value: ABUNDANT STAPHYLOCOCCUS AUREUS     Note: RIFAMPIN AND GENTAMICIN SHOULD NOT BE USED AS SINGLE DRUGS FOR TREATMENT OF STAPH INFECTIONS.   Report Status PENDING   Incomplete    Medical History: Past Medical History  Diagnosis Date  . Diabetes mellitus   . Hypertension   . High cholesterol   . Depression   . Anxiety    Medications:  Scheduled:     . amitriptyline  50 mg Oral QHS  . citalopram  40 mg Oral Daily  . insulin aspart  0-5 Units Subcutaneous QHS  . insulin aspart  0-9 Units Subcutaneous TID WC  . metoprolol  12.5 mg Oral BID  . metronidazole  500 mg Intravenous Q8H  . potassium chloride  40 mEq Oral BID  . simvastatin  40 mg Oral q1800  . sodium chloride  3 mL Intravenous Q12H  . sodium chloride      . sodium chloride      . vancomycin  125 mg Oral QID  . vancomycin  1,000 mg Intravenous Q24H  . DISCONTD: metoprolol  50 mg Oral BID   Assessment: 50yoF MRSA wound of buttock and  perineal area. Renal function has improved to baseline. Vancomycin trough is therapeutic.   Goal of Therapy:  Vancomycin trough level 10-15 mcg/ml Eradicate infection.  Plan: 1) Continue Vancomycin 1gm iv q24hrs 2) Monitor weekly Scr & vancomycin trough while on vancomycin. 3) F/U culture data sensitivities 4) Change Flagyl to po per P&T policy.  Elson Clan 09/24/2011,10:27 AM

## 2011-09-24 NOTE — Discharge Summary (Signed)
Physician Discharge Summary  ZYKIA WALLA MRN: [id number] DOB/AGE: 01-Aug-1960 51 y.o.  PCP: Reynolds Bowl, MD, MD   Admit date: 09/20/2011 Discharge date: 09/24/2011  Discharge Diagnoses:  1. C. difficile colitis. 2. Cellulitis and resolved previous abscess of the buttock. The culture grew out MRSA, but clinically, there was no abscess. 3. Metabolic acidosis secondary to diarrhea. Resolved following hydration and gentle bicarbonate. 4. Acute renal failure secondary to dehydration/prerenal azotemia. Resolved. 5. Hypercalcemia, secondary to dehydration. Resolved. 6. Known type 2 diabetes mellitus, previously diet controlled. Hemoglobin A1c was 7.5. 7. Hypokalemia. Resolved with repletion. 8. Normocytic anemia. The patient's anemia panel revealed a normal iron of 61, TIBC of 241, ferritin of 544, folate of 9.5, and vitamin B12 of 941. Further outpatient evaluation and management will be deferred to her primary care physician. 9. History of hypertension, with low normal blood pressures initially. 10. Hypomagnesemia.    Medication List  As of 09/24/2011  4:09 PM   STOP taking these medications         ibuprofen 800 MG tablet         TAKE these medications         amitriptyline 50 MG tablet   Commonly known as: ELAVIL   Take 50 mg by mouth at bedtime.      citalopram 40 MG tablet   Commonly known as: CELEXA   Take 40 mg by mouth daily.      clonazePAM 0.5 MG tablet   Commonly known as: KLONOPIN   Take 0.5 mg by mouth 3 (three) times daily as needed. For anxiety      glipiZIDE 5 MG tablet   Commonly known as: GLUCOTROL   Take half a tablet daily for treatment of diabetes.      lisinopril-hydrochlorothiazide 20-12.5 MG per tablet   Commonly known as: PRINZIDE,ZESTORETIC   Take 1 tablet by mouth daily.      metoprolol 50 MG tablet   Commonly known as: LOPRESSOR   Take 50 mg by mouth 2 (two) times daily.      metroNIDAZOLE 500 MG tablet   Commonly known as:  FLAGYL   Take 1 tablet (500 mg total) by mouth every 8 (eight) hours.      potassium chloride SA 20 MEQ tablet   Commonly known as: K-DUR,KLOR-CON   Take 1 tablet (20 mEq total) by mouth 2 (two) times daily.      pravastatin 40 MG tablet   Commonly known as: PRAVACHOL   Take 80 mg by mouth at bedtime.            Discharge Condition: Improved.  Disposition: 01-Home or Self Care   Consults: Franky Macho, M.D.   Significant Diagnostic Studies: Ct Pelvis Wo Contrast  09/20/2011  *RADIOLOGY REPORT*  Clinical Data:  Blisters bilateral buttocks for 6 days.  Pain.  CT PELVIS WITHOUT CONTRAST  Technique:  Multidetector CT imaging of the pelvis was performed following the standard protocol without intravenous contrast.  Comparison:  CT abdomen and pelvis 02/20/2009  Findings:  Soft tissue infiltration in the subcutaneous fat over the buttock regions bilaterally.  This may represent inflammation or edema.  Small soft tissue calcification on the right may represent injection granuloma.  No focal loculated fluid collection is demonstrated.  No evidence of soft tissue abscess.  Visualized pelvic organs appear intact.  The uterus and adnexal structures are not enlarged.  No inflammatory changes in the sigmoid colon.  The appendix is normal.  Visualized small and  large bowel loops are not distended.  Degenerative changes in the lower lumbar spine.  IMPRESSION: Minimal infiltration in the subcutaneous fat over the buttocks.  No evidence of soft tissue abscess.  Original Report Authenticated By: Marlon Pel, M.D.     Microbiology: Recent Results (from the past 240 hour(s))  STOOL CULTURE     Status: Normal (Preliminary result)   Collection Time   09/21/11  1:06 AM      Component Value Range Status Comment   Specimen Description STOOL   Final    Special Requests NONE   Final    Culture NO SUSPICIOUS COLONIES, CONTINUING TO HOLD   Final    Report Status PENDING   Incomplete   CLOSTRIDIUM  DIFFICILE BY PCR     Status: Abnormal   Collection Time   09/21/11  1:08 AM      Component Value Range Status Comment   C difficile by pcr POSITIVE (*) NEGATIVE  Final   WOUND CULTURE     Status: Normal   Collection Time   09/21/11 12:46 PM      Component Value Range Status Comment   Specimen Description VULVA   Final    Special Requests Normal   Final    Gram Stain     Final    Value: RARE WBC PRESENT, PREDOMINANTLY PMN     RARE SQUAMOUS EPITHELIAL CELLS PRESENT     MODERATE GRAM POSITIVE COCCI     IN CLUSTERS   Culture     Final    Value: ABUNDANT METHICILLIN RESISTANT STAPHYLOCOCCUS AUREUS     Note: RIFAMPIN AND GENTAMICIN SHOULD NOT BE USED AS SINGLE DRUGS FOR TREATMENT OF STAPH INFECTIONS. This organism DOES NOT demonstrate inducible Clindamycin resistance in vitro. CRITICAL RESULT CALLED TO, READ BACK BY AND VERIFIED WITH: VALDESE RN      05.28.13 1130 BY PERCN   Report Status 09/24/2011 FINAL   Final    Organism ID, Bacteria METHICILLIN RESISTANT STAPHYLOCOCCUS AUREUS   Final      Labs: Results for orders placed during the hospital encounter of 09/20/11 (from the past 48 hour(s))  CBC     Status: Abnormal   Collection Time   09/23/11  5:38 AM      Component Value Range Comment   WBC 11.2 (*) 4.0 - 10.5 (K/uL)    RBC 3.45 (*) 3.87 - 5.11 (MIL/uL)    Hemoglobin 9.7 (*) 12.0 - 15.0 (g/dL)    HCT 16.1 (*) 09.6 - 46.0 (%)    MCV 84.9  78.0 - 100.0 (fL)    MCH 28.1  26.0 - 34.0 (pg)    MCHC 33.1  30.0 - 36.0 (g/dL)    RDW 04.5  40.9 - 81.1 (%)    Platelets 277  150 - 400 (K/uL)   BASIC METABOLIC PANEL     Status: Abnormal   Collection Time   09/23/11  5:38 AM      Component Value Range Comment   Sodium 138  135 - 145 (mEq/L)    Potassium 2.8 (*) 3.5 - 5.1 (mEq/L)    Chloride 105  96 - 112 (mEq/L)    CO2 23  19 - 32 (mEq/L)    Glucose, Bld 138 (*) 70 - 99 (mg/dL)    BUN 31 (*) 6 - 23 (mg/dL)    Creatinine, Ser 9.14  0.50 - 1.10 (mg/dL)    Calcium 9.3  8.4 - 10.5 (mg/dL)     GFR calc non  Af Amer 81 (*) >90 (mL/min)    GFR calc Af Amer >90  >90 (mL/min)   HEMOGLOBIN A1C     Status: Abnormal   Collection Time   09/23/11  5:38 AM      Component Value Range Comment   Hemoglobin A1C 7.5 (*) <5.7 (%)    Mean Plasma Glucose 169 (*) <117 (mg/dL)   VANCOMYCIN, TROUGH     Status: Normal   Collection Time   09/23/11  3:07 PM      Component Value Range Comment   Vancomycin Tr 10.7  10.0 - 20.0 (ug/mL)   GLUCOSE, CAPILLARY     Status: Abnormal   Collection Time   09/23/11  6:45 PM      Component Value Range Comment   Glucose-Capillary 227 (*) 70 - 99 (mg/dL)   GLUCOSE, CAPILLARY     Status: Abnormal   Collection Time   09/23/11  8:26 PM      Component Value Range Comment   Glucose-Capillary 155 (*) 70 - 99 (mg/dL)   CBC     Status: Abnormal   Collection Time   09/24/11  6:15 AM      Component Value Range Comment   WBC 10.3  4.0 - 10.5 (K/uL)    RBC 3.37 (*) 3.87 - 5.11 (MIL/uL)    Hemoglobin 9.5 (*) 12.0 - 15.0 (g/dL)    HCT 16.1 (*) 09.6 - 46.0 (%)    MCV 86.1  78.0 - 100.0 (fL)    MCH 28.2  26.0 - 34.0 (pg)    MCHC 32.8  30.0 - 36.0 (g/dL)    RDW 04.5  40.9 - 81.1 (%)    Platelets 296  150 - 400 (K/uL)   BASIC METABOLIC PANEL     Status: Abnormal   Collection Time   09/24/11  6:15 AM      Component Value Range Comment   Sodium 139  135 - 145 (mEq/L)    Potassium 3.6  3.5 - 5.1 (mEq/L)    Chloride 107  96 - 112 (mEq/L)    CO2 25  19 - 32 (mEq/L)    Glucose, Bld 146 (*) 70 - 99 (mg/dL)    BUN 17  6 - 23 (mg/dL)    Creatinine, Ser 9.14  0.50 - 1.10 (mg/dL)    Calcium 9.5  8.4 - 10.5 (mg/dL)    GFR calc non Af Amer >90  >90 (mL/min)    GFR calc Af Amer >90  >90 (mL/min)   VITAMIN B12     Status: Abnormal   Collection Time   09/24/11  6:15 AM      Component Value Range Comment   Vitamin B-12 941 (*) 211 - 911 (pg/mL)   FOLATE     Status: Normal   Collection Time   09/24/11  6:15 AM      Component Value Range Comment   Folate 9.5     IRON AND TIBC      Status: Abnormal   Collection Time   09/24/11  6:15 AM      Component Value Range Comment   Iron 61  42 - 135 (ug/dL)    TIBC 782 (*) 956 - 470 (ug/dL)    Saturation Ratios 25  20 - 55 (%)    UIBC 180  125 - 400 (ug/dL)   FERRITIN     Status: Abnormal   Collection Time   09/24/11  6:15 AM      Component Value  Range Comment   Ferritin 544 (*) 10 - 291 (ng/mL)   MAGNESIUM     Status: Abnormal   Collection Time   09/24/11  6:15 AM      Component Value Range Comment   Magnesium 1.3 (*) 1.5 - 2.5 (mg/dL)   GLUCOSE, CAPILLARY     Status: Abnormal   Collection Time   09/24/11  7:31 AM      Component Value Range Comment   Glucose-Capillary 134 (*) 70 - 99 (mg/dL)   GLUCOSE, CAPILLARY     Status: Abnormal   Collection Time   09/24/11 11:25 AM      Component Value Range Comment   Glucose-Capillary 176 (*) 70 - 99 (mg/dL)      HPI : The patient is a 51 year old woman with a history significant for hypertension, type 2 diabetes mellitus, and depression. Approximately 3 weeks before she presented to the emergency department on 09/20/2011, she had been treated with an antibiotic for a right buttock boil. She presented with a chief complaint of buttock pain and diarrhea. In the emergency department, she was tachycardic with a heart rate of 120 beats per minute and afebrile. Her blood pressure was 111/79. Her lab data were significant for a white blood cell count was 30.6, glucose of 209, BUN of 98, creatinine of 2.21, normal lactic acid of 0.7, normal procalcitonin of 0.11, and a sedimentation rate of 52. CT scan of her abdomen and pelvis revealed soft tissue infiltration in the subcutaneous fat over the buttocks bilaterally, but no focal loculated fluid collection or evidence of soft tissue abscess. She was admitted for further evaluation and management.  HOSPITAL COURSE: The patient was started on IV vancomycin empirically for right buttock cellulitis versus cellulitis/abscess. Apparently, a specimen  was sent for culturing. IV fluids were given for IV his dehydration. Because of the acidosis, IV fluids were changed to add bicarbonate. A stool specimen was sent for assessment of C. difficile colitis. It became positive. She was subsequently started on  Flagyl. The next day, Flagyl was changed to IV and  oral vancomycin was started to treat C. difficile colitis.. General surgeon, Dr. Lovell Sheehan was consulted to evaluate the right buttock wound. Per his assessment and physical examination, there was no abscess cavity identified, just a perineal wound that appeared to be healing. It did not require surgical intervention. He ordered sitz baths for local wound care.  The patient's venous glucose was noted to be persistently elevated. The patient reported a history of type 2 diabetes, treated with an oral agent years ago. However, her blood sugars had improved and she was treating it with diet alone at home. Nevertheless, she was started on sliding scale NovoLog. Her hemoglobin A1c was noted to be 7.5. Her diet was changed accordingly. Prior to discharge, she was given a prescription for glipizide to be taken as prescribed. She was also given a prescription for a glucometer and diabetes supplies. She was instructed to check her blood sugars at least once daily at home.  Following several days of sitz baths, the patient's perineal/right buttock wound became clean and demonstrated granulation tissue growth. There was no purulent drainage or foul odor from the wound site. The culture did grow out MRSA, however, both Dr. Lovell Sheehan and I thought that this was a  colonization and not a true infection. After she received 4 and a half days of IV vancomycin, it was discontinued. No further antibiotic therapy was prescribed at the time of discharge.  The  patient's diarrhea subsided substantially. She had no complaints of abdominal pain, nausea, and vomiting once her diet was advanced. She received a commendation of Flagyl and  vancomycin over a 4-1/2 day hospitalization. She was discharged to home on oral metronidazole for 7 more days for treatment of C. difficile colitis.   At the time of discharge, she was hemodynamically stable. Her white blood cell count normalized. She remained afebrile. Her potassium chloride improved to 3.6 prior to discharge. Her creatinine normalized to 0.74 and her BUN normalized to 17 prior to discharge. She was noted to have anemia which was consistent with anemia of chronic disease. Further evaluation and management will be deferred to her primary care physician. She also was noted to have a low magnesium level. She was given IV magnesium sulfate once. Oral magnesium supplementation was not prescribed upon discharge due to the patient's recent diarrhea.     Discharge Exam: Blood pressure 133/76, pulse 86, temperature 98.8 F (37.1 C), temperature source Oral, resp. rate 20, height 5\' 2"  (1.575 m), weight 81.194 kg (179 lb), SpO2 96.00%.  Lungs clear to auscultation bilaterally. Heart: S1, S2, with no murmurs rubs or gallops. Abdomen: Positive bowel sounds, soft, obese, nontender, nondistended. Buttock: Clean wound base with no purulent drainage. Extremities: No pedal edema.   Discharge Orders    Future Orders Please Complete By Expires   Diet Carb Modified      Increase activity slowly      Discharge instructions      Comments:   Continue sitz baths daily or as previously recommended. You can followup with general surgeon Dr. Lovell Sheehan as needed for the wound on your buttock. Continue taking metronidazole until completed. You're being started on a diabetes pill called glipizide. Take it as prescribed. You will need to check your blood sugars at least once daily.      Follow-up Information    Follow up with Reynolds Bowl, MD. (Follow up as previously scheduled,)    Contact information:   [address] [place] [address] [phone]          Total  discharge time: 40 minutes.  Signed: Alannie Amodio 09/24/2011, 4:09 PM

## 2011-09-25 LAB — STOOL CULTURE

## 2011-09-30 NOTE — Care Management Note (Signed)
    Page 1 of 1   09/30/2011     4:33:10 PM   CARE MANAGEMENT NOTE 09/30/2011  Patient:  Betty Fuentes, Betty Fuentes   Account Number:  [id number]  Date Initiated:  09/24/2011  Documentation initiated by:  Anibal Henderson  Subjective/Objective Assessment:   Pt admitted with c-diff colitis, and abscess of the buttock, healing. Pt is from home, and is independent.  She denies  CM needs     Action/Plan:   No needs identified. Medications, per MD are $4 meds   Anticipated DC Date:  09/30/2011   Anticipated DC Plan:  HOME/SELF CARE      DC Planning Services  CM consult      Choice offered to / List presented to:             Status of service:  Completed, signed off Medicare Important Message given?   (If response is "NO", the following Medicare IM given date fields will be blank) Date Medicare IM given:   Date Additional Medicare IM given:    Discharge Disposition:  HOME/SELF CARE  Per UR Regulation:    If discussed at Long Length of Stay Meetings, dates discussed:    Comments:  09/30/11 Delayed entry 1630 Anibal Henderson RN

## 2011-11-11 ENCOUNTER — Other Ambulatory Visit (HOSPITAL_COMMUNITY): Payer: Self-pay | Admitting: Family Medicine

## 2011-11-11 DIAGNOSIS — Z139 Encounter for screening, unspecified: Secondary | ICD-10-CM

## 2011-11-27 NOTE — Progress Notes (Signed)
UR Chart Review Completed  

## 2011-12-16 ENCOUNTER — Ambulatory Visit (HOSPITAL_COMMUNITY): Payer: Self-pay

## 2011-12-24 ENCOUNTER — Ambulatory Visit (HOSPITAL_COMMUNITY)
Admission: RE | Admit: 2011-12-24 | Discharge: 2011-12-24 | Disposition: A | Discharge status: Home / Self Care | Payer: Self-pay | Source: Ambulatory Visit | Attending: Family Medicine | Admitting: Family Medicine

## 2011-12-24 DIAGNOSIS — Z139 Encounter for screening, unspecified: Secondary | ICD-10-CM

## 2011-12-24 DIAGNOSIS — Z1231 Encounter for screening mammogram for malignant neoplasm of breast: Secondary | ICD-10-CM | POA: Insufficient documentation

## 2012-11-30 ENCOUNTER — Ambulatory Visit (HOSPITAL_COMMUNITY)
Admission: RE | Admit: 2012-11-30 | Discharge: 2012-11-30 | Disposition: A | Discharge status: Home / Self Care | Payer: Self-pay | Source: Ambulatory Visit | Attending: Neurology | Admitting: Neurology

## 2012-11-30 ENCOUNTER — Other Ambulatory Visit: Payer: Self-pay | Admitting: Neurology

## 2012-11-30 DIAGNOSIS — M25569 Pain in unspecified knee: Secondary | ICD-10-CM | POA: Insufficient documentation

## 2012-11-30 DIAGNOSIS — W19XXXA Unspecified fall, initial encounter: Secondary | ICD-10-CM

## 2012-11-30 DIAGNOSIS — S8990XA Unspecified injury of unspecified lower leg, initial encounter: Secondary | ICD-10-CM | POA: Insufficient documentation

## 2012-12-13 ENCOUNTER — Encounter (HOSPITAL_COMMUNITY): Payer: Self-pay | Admitting: Emergency Medicine

## 2012-12-13 ENCOUNTER — Emergency Department (HOSPITAL_COMMUNITY)
Admission: EM | Admit: 2012-12-13 | Discharge: 2012-12-13 | Disposition: A | Discharge status: Home / Self Care | Payer: Self-pay | Attending: Emergency Medicine | Admitting: Emergency Medicine

## 2012-12-13 DIAGNOSIS — Z79899 Other long term (current) drug therapy: Secondary | ICD-10-CM | POA: Insufficient documentation

## 2012-12-13 DIAGNOSIS — F329 Major depressive disorder, single episode, unspecified: Secondary | ICD-10-CM | POA: Insufficient documentation

## 2012-12-13 DIAGNOSIS — M25562 Pain in left knee: Secondary | ICD-10-CM

## 2012-12-13 DIAGNOSIS — I10 Essential (primary) hypertension: Secondary | ICD-10-CM | POA: Insufficient documentation

## 2012-12-13 DIAGNOSIS — F411 Generalized anxiety disorder: Secondary | ICD-10-CM | POA: Insufficient documentation

## 2012-12-13 DIAGNOSIS — E119 Type 2 diabetes mellitus without complications: Secondary | ICD-10-CM | POA: Insufficient documentation

## 2012-12-13 DIAGNOSIS — Y929 Unspecified place or not applicable: Secondary | ICD-10-CM | POA: Insufficient documentation

## 2012-12-13 DIAGNOSIS — S8990XA Unspecified injury of unspecified lower leg, initial encounter: Secondary | ICD-10-CM | POA: Insufficient documentation

## 2012-12-13 DIAGNOSIS — Z91040 Latex allergy status: Secondary | ICD-10-CM | POA: Insufficient documentation

## 2012-12-13 DIAGNOSIS — W1809XA Striking against other object with subsequent fall, initial encounter: Secondary | ICD-10-CM | POA: Insufficient documentation

## 2012-12-13 DIAGNOSIS — S99929A Unspecified injury of unspecified foot, initial encounter: Secondary | ICD-10-CM | POA: Insufficient documentation

## 2012-12-13 DIAGNOSIS — E7800 Pure hypercholesterolemia, unspecified: Secondary | ICD-10-CM | POA: Insufficient documentation

## 2012-12-13 DIAGNOSIS — F3289 Other specified depressive episodes: Secondary | ICD-10-CM | POA: Insufficient documentation

## 2012-12-13 DIAGNOSIS — Y9389 Activity, other specified: Secondary | ICD-10-CM | POA: Insufficient documentation

## 2012-12-13 MED ORDER — OXYCODONE-ACETAMINOPHEN 5-325 MG PO TABS: 1.0000 | ORAL_TABLET | Freq: Four times a day (QID) | ORAL | Status: DC | PRN

## 2012-12-13 MED ORDER — OXYCODONE-ACETAMINOPHEN 5-325 MG PO TABS
1.0000 | ORAL_TABLET | Freq: Once | ORAL | Status: AC
  Administered 2012-12-13: 1 via ORAL
  Filled 2012-12-13: qty 1

## 2012-12-13 NOTE — ED Notes (Signed)
Fell x 11 days ago. Seen here for outpt xrays by dr Gerilyn Pilgrim. Was called Friday and was told to come back to [facility]. Was not told why or when to come back. Pt ambulating with slight limp and has knee brace on left knee. Nad.

## 2012-12-13 NOTE — ED Provider Notes (Signed)
CSN: [id number]     Arrival date & time 12/13/12  4098 History     First MD Initiated Contact with Patient 12/13/12 [phone]     Chief Complaint  Patient presents with  . Leg Pain   (Consider location/radiation/quality/duration/timing/severity/associated sxs/prior Treatment) Patient is a 52 y.o. female presenting with leg pain. The history is provided by the patient.  Leg Pain Location:  Knee Time since incident:  11 days Injury: yes   Mechanism of injury: fall   Fall:    Fall occurred:  Standing   Impact surface:  Hard floor   Point of impact:  Knees   Entrapped after fall: no   Knee location:  L knee Pain details:    Quality:  Aching and throbbing   Radiates to:  Does not radiate   Severity:  Moderate   Onset quality:  Sudden   Timing:  Constant   Progression:  Unchanged Chronicity:  New Dislocation: no   Foreign body present:  No foreign bodies Prior injury to area:  No Relieved by:  Immobilization Worsened by:  Activity, flexion and rotation Ineffective treatments:  NSAIDs Associated symptoms: swelling   Associated symptoms: no decreased ROM, no fatigue, no fever, no muscle weakness, no neck pain, no numbness, no stiffness and no tingling     Past Medical History  Diagnosis Date  . Diabetes mellitus   . Hypertension   . High cholesterol   . Depression   . Anxiety    Past Surgical History  Procedure Laterality Date  . Facial reconstruction surgery    . Back surgery    . Cervical spine surgery    . Skin graft     History reviewed. No pertinent family history. History  Substance Use Topics  . Smoking status: Never Smoker   . Smokeless tobacco: Not on file  . Alcohol Use: No   OB History   Grav Para Term Preterm Abortions TAB SAB Ect Mult Living                 Review of Systems  Constitutional: Negative for fever, chills and fatigue.  HENT: Negative for neck pain.   Genitourinary: Negative for dysuria and difficulty urinating.  Musculoskeletal:  Positive for joint swelling and arthralgias. Negative for stiffness.  Skin: Negative for color change and wound.  All other systems reviewed and are negative.    Allergies  Latex  Home Medications   Current Outpatient Rx  Name  Route  Sig  Dispense  Refill  . amitriptyline (ELAVIL) 50 MG tablet   Oral   Take 50 mg by mouth at bedtime.         . citalopram (CELEXA) 40 MG tablet   Oral   Take 40 mg by mouth daily.         . clonazePAM (KLONOPIN) 0.5 MG tablet   Oral   Take 0.5 mg by mouth 3 (three) times daily as needed. For anxiety         . glipiZIDE (GLUCOTROL) 5 MG tablet      Take half a tablet daily for treatment of diabetes.   15 tablet   2   . lisinopril-hydrochlorothiazide (PRINZIDE,ZESTORETIC) 20-12.5 MG per tablet   Oral   Take 1 tablet by mouth daily.         . metoprolol (LOPRESSOR) 50 MG tablet   Oral   Take 50 mg by mouth 2 (two) times daily.         Marland Kitchen EXPIRED: potassium  chloride SA (K-DUR,KLOR-CON) 20 MEQ tablet   Oral   Take 1 tablet (20 mEq total) by mouth 2 (two) times daily.   14 tablet   0   . pravastatin (PRAVACHOL) 40 MG tablet   Oral   Take 80 mg by mouth at bedtime.          BP 113/94  Pulse 76  Temp(Src) 97.7 F (36.5 C) (Oral)  Resp 17  SpO2 96% Physical Exam  Nursing note and vitals reviewed. Constitutional: She is oriented to person, place, and time. She appears well-developed and well-nourished. No distress.  Cardiovascular: Normal rate, regular rhythm, normal heart sounds and intact distal pulses.   Pulmonary/Chest: Effort normal and breath sounds normal.  Musculoskeletal: She exhibits edema and tenderness.       Left knee: She exhibits swelling. She exhibits normal range of motion, no effusion, no ecchymosis, no deformity, no laceration and no erythema. Tenderness found. Medial joint line tenderness noted. No patellar tendon tenderness noted.       Legs: ttp of the medial left knee just inferior to the patella.  Mild to moderate STS present.  No erythema, effusion, or step-off deformity.  DP pulse brisk, distal sensation intact. Calf is soft and NT.  Neurological: She is alert and oriented to person, place, and time. She exhibits normal muscle tone. Coordination normal.  Skin: Skin is warm and dry. No erythema.    ED Course   Procedures (including critical care time)  Labs Reviewed - No data to display No results found. No diagnosis found.  MDM    Patient with mild STS of the medial left knee just inferior to the patella.  Pain with flexion and wt bearing.  Plain film from 11/30/12 was neg for fx.  Sx's concerning for internal derangement.  Will schedule MR of knee.  Pt placed in knee immobilizer, crutches given.  Pain improved, remains NV intact.    MR left knee scheduled for Monday 12/14/12 at 8:00 pm.  Agrees to schedule f/u with Dr. Hilda Lias or Dr. Romeo Apple after MRI.  VSS.  She appears stable for d/c  Oley Lahaie L. Trisha Mangle, PA-C 12/16/12 1447

## 2012-12-14 ENCOUNTER — Ambulatory Visit (HOSPITAL_COMMUNITY)
Admit: 2012-12-14 | Discharge: 2012-12-14 | Disposition: A | Discharge status: Home / Self Care | Payer: Self-pay | Source: Ambulatory Visit | Attending: Emergency Medicine | Admitting: Emergency Medicine

## 2012-12-14 DIAGNOSIS — M25569 Pain in unspecified knee: Secondary | ICD-10-CM | POA: Insufficient documentation

## 2012-12-14 DIAGNOSIS — W19XXXA Unspecified fall, initial encounter: Secondary | ICD-10-CM | POA: Insufficient documentation

## 2012-12-14 DIAGNOSIS — M7120 Synovial cyst of popliteal space [Baker], unspecified knee: Secondary | ICD-10-CM | POA: Insufficient documentation

## 2012-12-14 DIAGNOSIS — IMO0002 Reserved for concepts with insufficient information to code with codable children: Secondary | ICD-10-CM | POA: Insufficient documentation

## 2012-12-16 ENCOUNTER — Telehealth: Payer: Self-pay | Admitting: Orthopedic Surgery

## 2012-12-16 NOTE — Telephone Encounter (Signed)
Patient called to relay that she had been seen in the Emergency Room at [facility], initially on 11/30/12 due to a fall, then said had been seen at her primary care physician's office, Dr. Lysbeth Galas; states had been back to [facility], and had an MRI there of her left knee.  She mentioned it may have been the Emergency Room Doctor ordered the MRI.  She states she's not heard back from anyone from there about MRI results, but was told at initial Emergency visit to see either Dr. Romeo Apple or Dr. Hilda Lias.  She explained she is self-pay.  I relayed the self-pay policy information, and offered to schedule when patient has been advised by the ordering physician of the MRI.  Patient ph# [phone]

## 2012-12-20 NOTE — ED Provider Notes (Signed)
Medical screening examination/treatment/procedure(s) were performed by non-physician practitioner and as supervising physician I was immediately available for consultation/collaboration.    Marely Apgar, MD 12/20/12 2131 

## 2013-02-01 ENCOUNTER — Other Ambulatory Visit (HOSPITAL_COMMUNITY): Payer: Self-pay | Admitting: General Practice

## 2013-02-01 DIAGNOSIS — Z139 Encounter for screening, unspecified: Secondary | ICD-10-CM

## 2013-02-19 ENCOUNTER — Ambulatory Visit (HOSPITAL_COMMUNITY): Payer: Self-pay

## 2013-03-01 ENCOUNTER — Ambulatory Visit (HOSPITAL_COMMUNITY): Payer: Self-pay

## 2013-03-08 ENCOUNTER — Ambulatory Visit (HOSPITAL_COMMUNITY): Payer: Self-pay

## 2013-03-17 ENCOUNTER — Encounter: Payer: Self-pay | Admitting: Orthopedic Surgery

## 2013-03-31 ENCOUNTER — Ambulatory Visit: Payer: Self-pay | Admitting: Orthopedic Surgery

## 2013-04-14 ENCOUNTER — Encounter: Payer: Self-pay | Admitting: Orthopedic Surgery

## 2013-04-14 ENCOUNTER — Ambulatory Visit (INDEPENDENT_AMBULATORY_CARE_PROVIDER_SITE_OTHER): Payer: Self-pay | Admitting: Orthopedic Surgery

## 2013-04-14 VITALS — BP 131/89 | Ht 62.0 in | Wt 193.0 lb

## 2013-04-14 DIAGNOSIS — M23322 Other meniscus derangements, posterior horn of medial meniscus, left knee: Secondary | ICD-10-CM

## 2013-04-14 DIAGNOSIS — M23329 Other meniscus derangements, posterior horn of medial meniscus, unspecified knee: Secondary | ICD-10-CM | POA: Insufficient documentation
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
## 2013-04-14 NOTE — Patient Instructions (Addendum)
Advice for now wear brace and use cane  Arthroscopic Procedure, Knee An arthroscopic procedure can find what is wrong with your knee. PROCEDURE Arthroscopy is a surgical technique that allows your orthopedic surgeon to diagnose and treat your knee injury with accuracy. They will look into your knee through a small instrument. This is almost like a small (pencil sized) telescope. Because arthroscopy affects your knee less than open knee surgery, you can anticipate a more rapid recovery. Taking an active role by following your caregiver's instructions will help with rapid and complete recovery. Use crutches, rest, elevation, ice, and knee exercises as instructed. The length of recovery depends on various factors including type of injury, age, physical condition, medical conditions, and your rehabilitation. Your knee is the joint between the large bones (femur and tibia) in your leg. Cartilage covers these bone ends which are smooth and slippery and allow your knee to bend and move smoothly. Two menisci, thick, semi-lunar shaped pads of cartilage which form a rim inside the joint, help absorb shock and stabilize your knee. Ligaments bind the bones together and support your knee joint. Muscles move the joint, help support your knee, and take stress off the joint itself. Because of this all programs and physical therapy to rehabilitate an injured or repaired knee require rebuilding and strengthening your muscles. AFTER THE PROCEDURE  After the procedure, you will be moved to a recovery area until most of the effects of the medication have worn off. Your caregiver will discuss the test results with you.  Only take over-the-counter or prescription medicines for pain, discomfort, or fever as directed by your caregiver. SEEK MEDICAL CARE IF:   You have increased bleeding from your wounds.  You see redness, swelling, or have increasing pain in your wounds.  You have pus coming from your wound.  You have an  oral temperature above 102 F (38.9 C).  You notice a bad smell coming from the wound or dressing.  You have severe pain with any motion of your knee. SEEK IMMEDIATE MEDICAL CARE IF:   You develop a rash.  You have difficulty breathing.  You have any allergic problems. Document Released: 04/12/2000 Document Revised: 07/08/2011 Document Reviewed: 11/04/2007 [facility] Patient Information 2014 [place], [place].

## 2013-04-15 ENCOUNTER — Telehealth: Payer: Self-pay | Admitting: Orthopedic Surgery

## 2013-04-15 NOTE — Telephone Encounter (Signed)
Called back to patient; left voice message per Dr. Mort Sawyers reply.
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
## 2013-04-15 NOTE — Telephone Encounter (Signed)
It doesn't work that way   she has to have evaluation history and physical patient description of problem with appropriate forms and then a precertification by the insurance company after she's had 6 weeks of NSAIDs and physical therapy

## 2013-05-09 ENCOUNTER — Emergency Department (HOSPITAL_COMMUNITY)
Admission: EM | Admit: 2013-05-09 | Discharge: 2013-05-10 | Disposition: A | Discharge status: Home / Self Care | Payer: Medicaid Other | Attending: Emergency Medicine | Admitting: Emergency Medicine

## 2013-05-09 ENCOUNTER — Emergency Department (HOSPITAL_COMMUNITY): Payer: Medicaid Other

## 2013-05-09 ENCOUNTER — Encounter (HOSPITAL_COMMUNITY): Payer: Self-pay | Admitting: Emergency Medicine

## 2013-05-09 DIAGNOSIS — E7800 Pure hypercholesterolemia, unspecified: Secondary | ICD-10-CM | POA: Insufficient documentation

## 2013-05-09 DIAGNOSIS — IMO0002 Reserved for concepts with insufficient information to code with codable children: Secondary | ICD-10-CM | POA: Insufficient documentation

## 2013-05-09 DIAGNOSIS — X500XXA Overexertion from strenuous movement or load, initial encounter: Secondary | ICD-10-CM | POA: Insufficient documentation

## 2013-05-09 DIAGNOSIS — Z87828 Personal history of other (healed) physical injury and trauma: Secondary | ICD-10-CM | POA: Insufficient documentation

## 2013-05-09 DIAGNOSIS — M23204 Derangement of unspecified medial meniscus due to old tear or injury, left knee: Secondary | ICD-10-CM

## 2013-05-09 DIAGNOSIS — Y929 Unspecified place or not applicable: Secondary | ICD-10-CM | POA: Insufficient documentation

## 2013-05-09 DIAGNOSIS — F3289 Other specified depressive episodes: Secondary | ICD-10-CM | POA: Insufficient documentation

## 2013-05-09 DIAGNOSIS — Z91040 Latex allergy status: Secondary | ICD-10-CM | POA: Insufficient documentation

## 2013-05-09 DIAGNOSIS — M23201 Derangement of unspecified lateral meniscus due to old tear or injury, left knee: Secondary | ICD-10-CM

## 2013-05-09 DIAGNOSIS — I10 Essential (primary) hypertension: Secondary | ICD-10-CM | POA: Insufficient documentation

## 2013-05-09 DIAGNOSIS — E119 Type 2 diabetes mellitus without complications: Secondary | ICD-10-CM | POA: Insufficient documentation

## 2013-05-09 DIAGNOSIS — Y9389 Activity, other specified: Secondary | ICD-10-CM | POA: Insufficient documentation

## 2013-05-09 DIAGNOSIS — F411 Generalized anxiety disorder: Secondary | ICD-10-CM | POA: Insufficient documentation

## 2013-05-09 DIAGNOSIS — M23207 Derangement of unspecified meniscus due to old tear or injury, left knee: Secondary | ICD-10-CM

## 2013-05-09 DIAGNOSIS — Z79899 Other long term (current) drug therapy: Secondary | ICD-10-CM | POA: Insufficient documentation

## 2013-05-09 DIAGNOSIS — F329 Major depressive disorder, single episode, unspecified: Secondary | ICD-10-CM | POA: Insufficient documentation

## 2013-05-09 MED ORDER — KETOROLAC TROMETHAMINE 60 MG/2ML IM SOLN
60.0000 mg | Freq: Once | INTRAMUSCULAR | Status: AC
  Administered 2013-05-10: 60 mg via INTRAMUSCULAR
  Filled 2013-05-09: qty 2

## 2013-05-09 NOTE — ED Provider Notes (Signed)
CSN: [id number]     Arrival date & time 05/09/13  2253 History   First MD Initiated Contact with Patient 05/09/13 2259     Chief Complaint  Patient presents with  . Knee Pain   (Consider location/radiation/quality/duration/timing/severity/associated sxs/prior Treatment) HPI  Patient  to this ER presents with complaints of left knee pain. She had an injury in July 2014 and has been seeing Dr. Romeo AppleHarrison for this. An MRI was done in August and noted that she has a meniscal tear. Pt reports she is due for surgery sometime soon. She is in the room with a non-family member and request that he leave the room before any further interview.  She admits to taking 4 pain pills this evening (she has norco 7.5-325) but reports that it is not helping and that she is having very bad pain and needs more medication. She has a knee immobilizer with her. She reports tonight she heard another pop and that her knee keeps "popping out". Denies any new injury  Past Medical History  Diagnosis Date  . Diabetes mellitus   . Hypertension   . High cholesterol   . Depression   . Anxiety    Past Surgical History  Procedure Laterality Date  . Facial reconstruction surgery    . Back surgery    . Cervical spine surgery    . Skin graft     No family history on file. History  Substance Use Topics  . Smoking status: Never Smoker   . Smokeless tobacco: Not on file  . Alcohol Use: No   OB History   Grav Para Term Preterm Abortions TAB SAB Ect Mult Living                 Review of Systems The patient denies anorexia, fever, weight loss,, vision loss, decreased hearing, hoarseness, chest pain, syncope, dyspnea on exertion, peripheral edema, balance deficits, hemoptysis, abdominal pain, melena, hematochezia, severe indigestion/heartburn, hematuria, incontinence, genital sores, muscle weakness, suspicious skin lesions, transient blindness, difficulty walking, depression, unusual weight change, abnormal bleeding,  enlarged lymph nodes, angioedema, and breast masses.  Allergies  Latex  Home Medications   Current Outpatient Rx  Name  Route  Sig  Dispense  Refill  . amitriptyline (ELAVIL) 50 MG tablet   Oral   Take 50 mg by mouth at bedtime.         . citalopram (CELEXA) 40 MG tablet   Oral   Take 40 mg by mouth daily.         . clonazePAM (KLONOPIN) 0.5 MG tablet   Oral   Take 0.5 mg by mouth 3 (three) times daily as needed. For anxiety         . glipiZIDE (GLUCOTROL) 5 MG tablet      Take half a tablet daily for treatment of diabetes.   15 tablet   2   . HYDROcodone-acetaminophen (NORCO) 7.5-325 MG per tablet   Oral   Take 1 tablet by mouth every 6 (six) hours as needed for moderate pain.         Marland Kitchen. lisinopril-hydrochlorothiazide (PRINZIDE,ZESTORETIC) 20-12.5 MG per tablet   Oral   Take 1 tablet by mouth daily.         . metFORMIN (GLUCOPHAGE) 1000 MG tablet   Oral   Take 1,000 mg by mouth 2 (two) times daily with a meal.         . metoprolol (LOPRESSOR) 50 MG tablet   Oral   Take 50 mg  by mouth 2 (two) times daily.         . pravastatin (PRAVACHOL) 40 MG tablet   Oral   Take 80 mg by mouth at bedtime.         Marland Kitchen oxyCODONE-acetaminophen (PERCOCET/ROXICET) 5-325 MG per tablet   Oral   Take 1 tablet by mouth every 6 (six) hours as needed for pain.   12 tablet   0   . EXPIRED: potassium chloride SA (K-DUR,KLOR-CON) 20 MEQ tablet   Oral   Take 1 tablet (20 mEq total) by mouth 2 (two) times daily.   14 tablet   0    BP 150/88  Pulse 100  Temp(Src) 97.7 F (36.5 C) (Oral)  Resp 20  Ht 5\' 2"  (1.575 m)  Wt 200 lb (90.719 kg)  BMI 36.57 kg/m2  SpO2 93% Physical Exam  Nursing note and vitals reviewed. Constitutional: She appears well-developed and well-nourished. No distress.  HENT:  Head: Normocephalic and atraumatic.  Eyes: Pupils are equal, round, and reactive to light.  Neck: Normal range of motion. Neck supple.  Cardiovascular: Normal rate and  regular rhythm.   Pulmonary/Chest: Effort normal.  Abdominal: Soft.  Musculoskeletal:       Left knee: She exhibits decreased range of motion and swelling. She exhibits no ecchymosis, no deformity, no laceration, no erythema and no LCL laxity. Tenderness found. MCL tenderness noted.  Neurological: She is alert.  Skin: Skin is warm and dry.    ED Course  Procedures (including critical care time) Labs Review Labs Reviewed - No data to display Imaging Review Dg Knee Complete 4 Views Left  05/09/2013   CLINICAL DATA:  Knee pain.  History of remote patellar fracture.  EXAM: LEFT KNEE - COMPLETE 4+ VIEW  COMPARISON:  12/01/2010  FINDINGS: There is likely trace suprapatellar joint fluid. Remote medial collateral ligament injury with heterotopic ossification. There is medial compartment narrowing which is mild-to-moderate. Subchondral sclerosis at the medial compartment. No evidence of acute fracture or malalignment.  IMPRESSION: 1. No acute osseous findings. 2. Degenerative medial compartment narrowing. 3. Remote medial collateral ligament injury.   Electronically Signed   By: Tiburcio Pea M.D.   On: 05/09/2013 23:44    EKG Interpretation   None       MDM   1. Chronic meniscal tear of knee, left    Patient admits to taking 4 of her 7.5 hydrocodones this evening for pain since 1 pill was not working. That is 1300mg  of Tylenol and 30 mg of Hydrocodone. I declined to give her narcotics in the ER since she is not taking her medication correctly.   Xray shows no new injuries per xray. She is supposed to be scheduling surgery in January by Dr. Romeo Apple.  Toradol 60 mg IM for pain. No rx since she already has pain medication.  53 y.o.Betty Fuentes's evaluation in the Emergency Department is complete. It has been determined that no acute conditions requiring further emergency intervention are present at this time. The patient/guardian have been advised of the diagnosis and plan. We have  discussed signs and symptoms that warrant return to the ED, such as changes or worsening in symptoms.  Vital signs are stable at discharge. Filed Vitals:   05/09/13 2255  BP: 150/88  Pulse: 100  Temp: 97.7 F (36.5 C)  Resp: 20    Patient/guardian has voiced understanding and agreed to follow-up with the PCP or specialist.    Dorthula Matas, PA-C 05/09/13 2355

## 2013-05-09 NOTE — ED Notes (Signed)
Pt injured left knee in July, states she is supposed to be having surgery done by dr Romeo Appleharrison, states the knee "keeps popping out" and she is having pain

## 2013-05-09 NOTE — Discharge Instructions (Signed)
I recommend that you keep your knee immobilizer on to give your knee more stabilization until surgical repair.    Knee, Cartilage (Meniscus) Injury It is suspected that you have a torn cartilage (meniscus) in your knee. The menisci are made of tough cartilage, and fit between the surfaces of the thigh and leg bones. The menisci are "C"shaped and have a wedged profile. The wedged profile helps the stability of the joint by keeping the rounded femur surface from sliding off the flat tibial surface. The menisci are fed (nourished) by small blood vessels; but there is also a large area at the inner edge of the meniscus that does not have a good blood supply (avascular). This presents a problem when there is an injury to the meniscus, because areas without good blood supply heal poorly. As a result when there is a torn cartilage in the knee, surgery is often required to fix it. This is usually done with a surgical procedure less invasive than open surgery (arthroscopy). Some times open surgery of the knee is required if there is other damage. PURPOSE OF THE MENISCUS The medial meniscus rests on the medial tibial plateau. The tibia is the large bone in your lower leg (the shin bone). The medial tibial plateau is the upper end of the bone making up the inner part of your knee. The lateral meniscus serves the same purpose and is located on the outside of the knee. The menisci help to distribute your body weight across the knee joint; they act as shock absorbers. Without the meniscus present, the weight of your body would be unevenly applied to the bones in your legs (the femur and tibia). The femur is the large bone in your thigh. This uneven weight distribution would cause increased wear and tear on the cartilage lining the joint surfaces, leading to early damage (arthritis) of these areas. The presence of the menisci cartilage is necessary for a healthy knee. PURPOSE OF THE KNEE CARTILAGE The knee joint is made up  of three bones: the thigh bone (femur), the shin bone (tibia), and the knee cap (patella). The surfaces of these bones at the knee joint are covered with cartilage called articular cartilage. This smooth slippery surface allows the bones to slide against each other without causing bone damage. The meniscus sits between these cartilaginous surfaces of the bones. It distributes the weight evenly in the joints and helps with the stability of the joint (keeps the joint steady). HOME CARE INSTRUCTIONS  Use crutches and external braces as instructed.  Once home an ice pack applied to your injured knee may help with discomfort and keep the swelling down. An ice pack can be used for the first couple of days or as instructed.  Only take over-the-counter or prescription medicines for pain, discomfort, or fever as directed by your caregiver.  Call if you do not have relief of pain with medications or if there is increasing in pain.  Call if your foot becomes cold or blue.  You may resume normal diet and activities as directed.  Make sure to keep your appointment with your follow-up caregiver. This injury may require further evaluation and treatment beyond the temporary treatment given today. Document Released: 07/06/2002 Document Revised: 07/08/2011 Document Reviewed: 10/28/2008 [facility] Patient Information 2014 [place], [place].  Arthroscopy, With Meniscus Injury, Care After Refer to this sheet in the next few weeks. These instructions provide you with general information on caring for yourself after your procedure. Your health care provider may also  give you specific instructions. Your treatment has been planned according to the current medical practices, but problems sometimes occur. Call your health care provider if you have any problems or questions after your procedure. WHAT TO EXPECT AFTER THE PROCEDURE After your procedure, it is typical to have the following:  Pain and swelling in your  knee.  Constipation.  Difficulty walking. HOME CARE INSTRUCTIONS   Use crutches and do knee exercises as directed by your health care provider.  Apply ice to the injured area:  Put ice in a plastic bag.  Place a towel between your skin and the bag.  Leave the ice on for 15 20 minutes, 3 4 times a day while awake. Do this for the first 2 days.  Rest and raise (elevate) your knee.  Change bandages (dressings) as directed by your health care provider.  Keep the wound dry and clean. The wound may be washed gently with soap and water. Gently blot or dab the wound dry. It is okay to take showers 24 48 hours after surgery. Do not take baths, use swimming pools, or use hot tubs for 14 days, or as directed by your health care provider.  Only take over-the-counter or prescription medicines for pain, discomfort, or fever as directed by your health care provider.  Continue your normal diet as directed by your health care provider.  Do not lift anything more than 10 pounds or play contact sports for 3 weeks, or as directed by your health care provider.  If a brace was applied, use as directed by your health care provider.  Your health care provider will help with instructions for rehabilitation of your knee. SEEK MEDICAL CARE IF:   You have increased bleeding (more than a small spot) from the wound.  You have redness, swelling, or increasing pain in the wound.  Yellowish-white fluid (pus) is coming from your wound. SEEK IMMEDIATE MEDICAL CARE IF:   You develop a rash.  You have a fever or persistent symptoms for more than 2 3 days.  You have difficulty breathing.  You have increasing pain with movement of the knee. MAKE SURE YOU:   Understand these instructions.  Will watch your condition.  Will get help right away if you are not doing well or get worse. Document Released: 11/02/2004 Document Revised: 12/16/2012 Document Reviewed: 09/22/2012 [facility] Patient Information  2014 [place].

## 2013-05-10 MED ORDER — OXYCODONE-ACETAMINOPHEN 5-325 MG PO TABS
2.0000 | ORAL_TABLET | Freq: Once | ORAL | Status: AC
Start: 2013-05-10 — End: 2013-05-10
  Administered 2013-05-10: 2 via ORAL
  Filled 2013-05-10: qty 2

## 2013-05-10 NOTE — ED Provider Notes (Addendum)
Medical screening examination/treatment/procedure(s) were conducted as a shared visit with non-physician practitioner(s) and myself.  I personally evaluated the patient during the encounter.  Patient presents after having acute onset of left-sided knee pain as she tried to get out of her car this morning. She had injured this knee several months ago.  Had a resultant MRI showing meniscal tear as well as a possible subchondral fracture. She has been walking with a knee brace and a cane or crutches since that time but overall has good pain control on most days. She has taken a total of 1 tablet of 7.5 mg with Tylenol hydrocodone today. She has increased pain with range of motion and on my exam has a soft compartment above and below the knee, decreased range of motion secondary to pain but no redness or warmth of the knee.  Doubt acute fracture, doubt infection, doubt hemarthrosis given no effusion. X-rays reveal no significant osseus findings, the patient stable for discharge, given one dose of Percocet as she is in a pain contract with her neurologist. Patient is agreeable to this plan, crutches for home to minimize weightbearing pain  Clinical Impression: Acute L knee pain        Vida RollerBrian D Thayer Embleton, MD 05/10/13 0015  Vida RollerBrian D Juliona Vales, MD 05/10/13 [phone]

## 2013-05-12 ENCOUNTER — Telehealth: Payer: Self-pay | Admitting: Orthopedic Surgery

## 2013-05-12 ENCOUNTER — Other Ambulatory Visit: Payer: Self-pay | Admitting: *Deleted

## 2013-05-12 NOTE — Telephone Encounter (Signed)
Route to nurse to review with patient, as patient has several concerns

## 2013-05-12 NOTE — Telephone Encounter (Signed)
Spoke with patient, and she is going to have Dr. Doonquah toGerilyn Pilgrim order MRI. I advised after she has that Dr. Romeo AppleHarrison could review and move forward with treatment plan.

## 2013-05-12 NOTE — Telephone Encounter (Signed)
However, before surgery I recommend that she have an MRI of her lumbar spine. Once that imaging has been completed an assessment is made on the best treatment for that prior to arthroscopic knee surgery.  Here is whats in my last note   So when she has an mri of her spine then i can see her NOT UNTIL

## 2013-05-12 NOTE — Telephone Encounter (Signed)
Patient called to (1) ask about having had an MRI recently-I relayed that she had received a call back about that (please review)                              (2) relay that she has [facility] for problem of "left leg and knee snapping out"                              (3) to request appointment                              (4) to relay that she is still under the [employer] discount (records in system indicate discount in effect through 07/08/13 Please review Emergency Room physician notes, left knee Xray, and MRI from August 2015, and last office note, which shows that patient had initially been approved for a 1-time evaluation. Please advise.  Patient's ph# is [phone].

## 2013-05-13 NOTE — Telephone Encounter (Signed)
05/13/13 patient called back; states has called Dr Ronal Fearoonquah's office about ordering the MRI of spine; she states she spoke with the CNA and the Nurse practitioner, and said was told that "either Dr Romeo AppleHarrison or primary care doctor would need to order MRI, or they would need some type of note from Dr Romeo AppleHarrison for them to order"/ she states her primary care, [facility]omstock, [facility], left 2 years ago, and that she has seen 5 other providers at this practice since then, so "they may not want to do this."  Please advise patient, ph# noted.

## 2013-05-13 NOTE — Telephone Encounter (Signed)
Faxed office notes to Dr. Gerilyn Pilgrimoonquah

## 2013-05-14 ENCOUNTER — Other Ambulatory Visit: Payer: Medicaid Other | Admitting: *Deleted

## 2013-05-14 DIAGNOSIS — M75102 Unspecified rotator cuff tear or rupture of left shoulder, not specified as traumatic: Secondary | ICD-10-CM

## 2013-05-14 NOTE — Addendum Note (Signed)
Addended by: Debby BudLONG, Malaney Mcbean R on: 05/14/2013 11:41 AM   Modules accepted: Orders

## 2013-05-17 ENCOUNTER — Other Ambulatory Visit: Payer: Self-pay | Admitting: Lab

## 2013-05-17 DIAGNOSIS — M549 Dorsalgia, unspecified: Secondary | ICD-10-CM

## 2013-05-27 ENCOUNTER — Ambulatory Visit (HOSPITAL_COMMUNITY)
Admission: RE | Admit: 2013-05-27 | Discharge: 2013-05-27 | Disposition: A | Discharge status: Home / Self Care | Payer: Medicaid Other | Source: Ambulatory Visit | Attending: Neurology | Admitting: Neurology

## 2013-05-27 DIAGNOSIS — M51379 Other intervertebral disc degeneration, lumbosacral region without mention of lumbar back pain or lower extremity pain: Secondary | ICD-10-CM | POA: Insufficient documentation

## 2013-05-27 DIAGNOSIS — M47817 Spondylosis without myelopathy or radiculopathy, lumbosacral region: Secondary | ICD-10-CM | POA: Insufficient documentation

## 2013-05-27 DIAGNOSIS — M545 Low back pain, unspecified: Secondary | ICD-10-CM | POA: Insufficient documentation

## 2013-05-27 DIAGNOSIS — M5137 Other intervertebral disc degeneration, lumbosacral region: Secondary | ICD-10-CM | POA: Insufficient documentation

## 2013-05-27 DIAGNOSIS — M549 Dorsalgia, unspecified: Secondary | ICD-10-CM

## 2013-05-27 DIAGNOSIS — M5126 Other intervertebral disc displacement, lumbar region: Secondary | ICD-10-CM | POA: Insufficient documentation

## 2013-06-07 ENCOUNTER — Telehealth: Payer: Self-pay | Admitting: Orthopedic Surgery

## 2013-06-07 NOTE — Telephone Encounter (Signed)
Left message for patient to get MRI results from Dr. Gerilyn Pilgrimoonquah since he ordered it.

## 2013-06-07 NOTE — Telephone Encounter (Signed)
Phil DoppBrenda Devaux asked for a call with her MRI results.  Does our office or Dr. Gerilyn Pilgrimoonquah give them to her, looks like he ordered it

## 2013-06-07 NOTE — Telephone Encounter (Signed)
call

## 2013-06-07 NOTE — Telephone Encounter (Signed)
Patient called back and states she had the MRI of her lumbar spine,as you requested. She is asking where to go from here. Please advise.

## 2013-06-08 ENCOUNTER — Telehealth: Payer: Self-pay | Admitting: Orthopedic Surgery

## 2013-06-08 NOTE — Telephone Encounter (Signed)
This note had been addressed - refer to additional notes after this date.  Patient aware of status.

## 2013-06-08 NOTE — Telephone Encounter (Signed)
IMPRESSION: 1. Lumbar spondylosis and degenerative disc disease, causing prominent impingement at L4-5 and mild impingement at L2-3 as detailed above. 2. There is 5 mm of degenerative anterolisthesis at L4-5, new compared to the prior CT scan of 02/20/2009.   Recommend neurosurgical referral   Had to leave message

## 2013-06-14 ENCOUNTER — Other Ambulatory Visit: Payer: Self-pay | Admitting: *Deleted

## 2013-06-14 ENCOUNTER — Telehealth: Payer: Self-pay | Admitting: *Deleted

## 2013-06-14 DIAGNOSIS — M5136 Other intervertebral disc degeneration, lumbar region: Secondary | ICD-10-CM

## 2013-06-14 NOTE — Telephone Encounter (Signed)
Faxed referral and office notes to [facility]. Awaiting appointment. 

## 2013-06-28 NOTE — Telephone Encounter (Signed)
Patient has an appointment with Dr. Jordan LikesPool 06/30/13 at 1:30 pm. Patient aware.

## 2013-07-02 ENCOUNTER — Other Ambulatory Visit: Payer: Self-pay | Admitting: Neurosurgery

## 2013-07-07 ENCOUNTER — Other Ambulatory Visit (HOSPITAL_COMMUNITY): Payer: Self-pay | Admitting: *Deleted

## 2013-07-07 NOTE — Pre-Procedure Instructions (Signed)
Betty Fuentes  07/07/2013   Your procedure is scheduled on:  Monday, July 19, 2013 at 7:45 AM.   Report to [facility] Entrance "A" Admitting Office at 5:30 AM.    Call this number if you have problems the morning of surgery: [phone]   Remember:   Do not eat food or drink liquids after midnight Sunday, 07/18/13.   Take these medicines the morning of surgery with A SIP OF WATER: citalopram (CELEXA), metoprolol (LOPRESSOR), clonazePAM (KLONOPIN) - if needed, you may take one of your pain pills if needed.  Do not take any of your diabetic medicines the morning of surgery.   Do not wear jewelry, make-up or nail polish.  Do not wear lotions, powders, or perfumes. You may wear deodorant.  Do not shave 48 hours prior to surgery.   Do not bring valuables to the hospital.  [facility] is not responsible                  for any belongings or valuables.               Contacts, dentures or bridgework may not be worn into surgery.  Leave suitcase in the car. After surgery it may be brought to your room.  For patients admitted to the hospital, discharge time is determined by your                treatment team.               Special Instructions: [place] - Preparing for Surgery  Before surgery, you can play an important role.  Because skin is not sterile, your skin needs to be as free of germs as possible.  You can reduce the number of germs on you skin by washing with CHG (chlorahexidine gluconate) soap before surgery.  CHG is an antiseptic cleaner which kills germs and bonds with the skin to continue killing germs even after washing.  Please DO NOT use if you have an allergy to CHG or antibacterial soaps.  If your skin becomes reddened/irritated stop using the CHG and inform your nurse when you arrive at Short Stay.  Do not shave (including legs and underarms) for at least 48 hours prior to the first CHG shower.  You may shave your face.  Please follow these instructions  carefully:   1.  Shower with CHG Soap the night before surgery and the                                morning of Surgery.  2.  If you choose to wash your hair, wash your hair first as usual with your       normal shampoo.  3.  After you shampoo, rinse your hair and body thoroughly to remove the                      Shampoo.  4.  Use CHG as you would any other liquid soap.  You can apply chg directly       to the skin and wash gently with scrungie or a clean washcloth.  5.  Apply the CHG Soap to your body ONLY FROM THE NECK DOWN.        Do not use on open wounds or open sores.  Avoid contact with your eyes, ears, mouth and genitals (private parts).  Wash genitals (private parts) with  your normal soap.  6.  Wash thoroughly, paying special attention to the area where your surgery        will be performed.  7.  Thoroughly rinse your body with warm water from the neck down.  8.  DO NOT shower/wash with your normal soap after using and rinsing off       the CHG Soap.  9.  Pat yourself dry with a clean towel.            10.  Wear clean pajamas.            11.  Place clean sheets on your bed the night of your first shower and do not        sleep with pets.  Day of Surgery  Do not apply any lotions the morning of surgery.  Please wear clean clothes to the hospital/surgery center.     Please read over the following fact sheets that you were given: Pain Booklet, Coughing and Deep Breathing, Blood Transfusion Information, MRSA Information and Surgical Site Infection Prevention

## 2013-07-08 ENCOUNTER — Encounter (HOSPITAL_COMMUNITY): Payer: Self-pay

## 2013-07-08 ENCOUNTER — Encounter (HOSPITAL_COMMUNITY)
Admission: RE | Admit: 2013-07-08 | Discharge: 2013-07-08 | Disposition: A | Discharge status: Home / Self Care | Payer: Medicaid Other | Source: Ambulatory Visit | Attending: Anesthesiology | Admitting: Anesthesiology

## 2013-07-08 ENCOUNTER — Encounter (HOSPITAL_COMMUNITY)
Admission: RE | Admit: 2013-07-08 | Discharge: 2013-07-08 | Disposition: A | Discharge status: Home / Self Care | Payer: Medicaid Other | Source: Ambulatory Visit | Attending: Neurosurgery | Admitting: Neurosurgery

## 2013-07-08 ENCOUNTER — Encounter (HOSPITAL_COMMUNITY): Payer: Self-pay | Admitting: Pharmacy Technician

## 2013-07-08 DIAGNOSIS — Z01818 Encounter for other preprocedural examination: Secondary | ICD-10-CM | POA: Insufficient documentation

## 2013-07-08 DIAGNOSIS — Z01812 Encounter for preprocedural laboratory examination: Secondary | ICD-10-CM | POA: Insufficient documentation

## 2013-07-08 DIAGNOSIS — Z01810 Encounter for preprocedural cardiovascular examination: Secondary | ICD-10-CM | POA: Insufficient documentation

## 2013-07-08 HISTORY — DX: Shortness of breath: R06.02

## 2013-07-08 HISTORY — DX: Fracture of nasal bones, initial encounter for closed fracture: S02.2XXA

## 2013-07-08 HISTORY — DX: Unspecified osteoarthritis, unspecified site: M19.90

## 2013-07-08 LAB — CBC WITH DIFFERENTIAL/PLATELET
BASOS PCT: 0 % (ref 0–1)
Basophils Absolute: 0 10*3/uL (ref 0.0–0.1)
EOS PCT: 5 % (ref 0–5)
Eosinophils Absolute: 0.5 10*3/uL (ref 0.0–0.7)
HEMATOCRIT: 37.3 % (ref 36.0–46.0)
Hemoglobin: 12.4 g/dL (ref 12.0–15.0)
Lymphocytes Relative: 34 % (ref 12–46)
Lymphs Abs: 3.8 10*3/uL (ref 0.7–4.0)
MCH: 28.2 pg (ref 26.0–34.0)
MCHC: 33.2 g/dL (ref 30.0–36.0)
MCV: 85 fL (ref 78.0–100.0)
MONO ABS: 0.6 10*3/uL (ref 0.1–1.0)
Monocytes Relative: 6 % (ref 3–12)
NEUTROS ABS: 6 10*3/uL (ref 1.7–7.7)
Neutrophils Relative %: 55 % (ref 43–77)
Platelets: 278 10*3/uL (ref 150–400)
RBC: 4.39 MIL/uL (ref 3.87–5.11)
RDW: 12.4 % (ref 11.5–15.5)
WBC: 10.9 10*3/uL — ABNORMAL HIGH (ref 4.0–10.5)

## 2013-07-08 LAB — BASIC METABOLIC PANEL
BUN: 22 mg/dL (ref 6–23)
CO2: 25 mEq/L (ref 19–32)
CREATININE: 1.42 mg/dL — AB (ref 0.50–1.10)
Calcium: 9.1 mg/dL (ref 8.4–10.5)
Chloride: 97 mEq/L (ref 96–112)
GFR calc Af Amer: 48 mL/min — ABNORMAL LOW (ref >90)
GFR, EST NON AFRICAN AMERICAN: 41 mL/min — AB (ref >90)
GLUCOSE: 389 mg/dL — AB (ref 70–99)
Potassium: 4.3 mEq/L (ref 3.7–5.3)
SODIUM: 136 meq/L — AB (ref 137–147)

## 2013-07-08 LAB — SURGICAL PCR SCREEN
MRSA, PCR: NEGATIVE
STAPHYLOCOCCUS AUREUS: NEGATIVE

## 2013-07-08 LAB — TYPE AND SCREEN
ABO/RH(D): O POS
Antibody Screen: NEGATIVE

## 2013-07-08 LAB — ABO/RH: ABO/RH(D): O POS

## 2013-07-12 ENCOUNTER — Encounter (HOSPITAL_COMMUNITY): Payer: Self-pay

## 2013-07-12 NOTE — Progress Notes (Addendum)
Anesthesia Chart Review:  Patient is a 53 year old female scheduled for L4-5 PLIF on 07/19/13 by Dr. Jordan Fuentes.  History includes non-smoker, DM2, HTN, hypercholesterolemia, depression, anxiety, arthritis, chronic DOE, facial reconstruction surgery, back and cervical spine surgery, skin graft. BMI is 34.4 consistent with obesity.  PCP was previously [facility].  Dr. Jorene Fuentes is now retired, and she had problems with her re-assigned physicians leaving the practice or rescheduling her appointments, so she recently got established with Dr[facility] Gwenyth AllegraJill [facility].  EKG on 07/08/13 showed SR with marked sinus arrhythmia. She [facility]/2 [facility] 3 years ago.  I will attempt to obtain a copy, but since she is no longer a patient there it may be difficult.  CXR on 07/08/13 showed:  1. There is no evidence of pneumonia nor CHF.  2. Minimally increased density at the lung bases is consistent with atelectasis or scarring.   Preoperative labs noted.  WBC was 10.9, H/H 12.4/37.3. BUN/Cr 22/1.42. Glucose at PAT was 389. She had coffee that morning. She reports that due to her transition between her medical doctors there was a lapse in her medication for 1 1/2 months.  She resumed her glipizide and metformin on 07/07/13, but did not begin her BID dosing until 07/10/13 because she had unintentionally forgot. She reports compliance with her diet.  Her fasting glucose within the last few days was 295, down from 495 when she was not on her medications.  I did stress the importance of medication and diet compliance.  I told her that if her sugars are significantly elevated (ie, > 300) on the day of surgery then it could effect her plans for surgery.  Dr. Jordan Fuentes may also want her glucose better controlled.  Fortunately her surgery is still a week away, and she is now back on her usual regimen so hopefully fasting glucose readings will  improve further.  I reviewed with anesthesiologist Dr. Noreene Fuentes and updated Betty Fuentes at Dr. Lindalou Fuentes's office.  I will attempt to follow-up with patient later this week to re-assess her fasting glucose results.   Betty Ochsllison Findley Blankenbaker, PA-C [facility]Anesthesiology Phone [phone] 07/12/2013 2:35 PM  Addendum: 07/16/2013 2:25 PM [facility] reported that there was no record of a stress test there. (She said she thought she had a exercise treadmill test there in the past that was normal.)  I called patient to follow-up regarding glucose control. She reports fasting readings have been 198 - 219 over the past three days which does show some improvement since resuming her DM regimen. I think if her reading remain in this range or better then she could proceed as planned.

## 2013-07-15 NOTE — Progress Notes (Signed)
Called [facility] and re-requested OV notes and Stress test.  Office states there was no stress test but will send OV notes.

## 2013-07-18 MED ORDER — CEFAZOLIN SODIUM-DEXTROSE 2-3 GM-% IV SOLR
2.0000 g | INTRAVENOUS | Status: AC
  Administered 2013-07-19: 2 g via INTRAVENOUS
  Filled 2013-07-18: qty 50

## 2013-07-18 MED ORDER — DEXAMETHASONE SODIUM PHOSPHATE 10 MG/ML IJ SOLN
10.0000 mg | INTRAMUSCULAR | Status: AC
  Administered 2013-07-19: 10 mg via INTRAVENOUS
  Filled 2013-07-18: qty 1

## 2013-07-19 ENCOUNTER — Encounter (HOSPITAL_COMMUNITY): Payer: Medicaid Other | Admitting: Vascular Surgery

## 2013-07-19 ENCOUNTER — Inpatient Hospital Stay (HOSPITAL_COMMUNITY): Payer: Medicaid Other | Admitting: Anesthesiology

## 2013-07-19 ENCOUNTER — Inpatient Hospital Stay (HOSPITAL_COMMUNITY)
Admission: RE | Admit: 2013-07-19 | Discharge: 2013-07-21 | DRG: 460 | Disposition: A | Discharge status: Home Health | Payer: Medicaid Other | Source: Ambulatory Visit | Attending: Neurosurgery | Admitting: Neurosurgery

## 2013-07-19 ENCOUNTER — Encounter (HOSPITAL_COMMUNITY)
Admission: RE | Disposition: A | Discharge status: Home Health | Payer: Medicaid Other | Source: Ambulatory Visit | Attending: Neurosurgery

## 2013-07-19 ENCOUNTER — Encounter (HOSPITAL_COMMUNITY): Payer: Self-pay | Admitting: Anesthesiology

## 2013-07-19 ENCOUNTER — Inpatient Hospital Stay (HOSPITAL_COMMUNITY): Payer: Medicaid Other

## 2013-07-19 DIAGNOSIS — M129 Arthropathy, unspecified: Secondary | ICD-10-CM | POA: Diagnosis present

## 2013-07-19 DIAGNOSIS — E7800 Pure hypercholesterolemia, unspecified: Secondary | ICD-10-CM | POA: Diagnosis present

## 2013-07-19 DIAGNOSIS — F411 Generalized anxiety disorder: Secondary | ICD-10-CM | POA: Diagnosis present

## 2013-07-19 DIAGNOSIS — I10 Essential (primary) hypertension: Secondary | ICD-10-CM | POA: Diagnosis present

## 2013-07-19 DIAGNOSIS — M48061 Spinal stenosis, lumbar region without neurogenic claudication: Principal | ICD-10-CM | POA: Diagnosis present

## 2013-07-19 DIAGNOSIS — F3289 Other specified depressive episodes: Secondary | ICD-10-CM | POA: Diagnosis present

## 2013-07-19 DIAGNOSIS — E119 Type 2 diabetes mellitus without complications: Secondary | ICD-10-CM | POA: Diagnosis present

## 2013-07-19 DIAGNOSIS — M4310 Spondylolisthesis, site unspecified: Secondary | ICD-10-CM | POA: Diagnosis present

## 2013-07-19 DIAGNOSIS — F329 Major depressive disorder, single episode, unspecified: Secondary | ICD-10-CM | POA: Diagnosis present

## 2013-07-19 LAB — GLUCOSE, CAPILLARY
GLUCOSE-CAPILLARY: 333 mg/dL — AB (ref 70–99)
GLUCOSE-CAPILLARY: 175 mg/dL — AB (ref 70–99)
Glucose-Capillary: 213 mg/dL — ABNORMAL HIGH (ref 70–99)
Glucose-Capillary: 216 mg/dL — ABNORMAL HIGH (ref 70–99)
Glucose-Capillary: 307 mg/dL — ABNORMAL HIGH (ref 70–99)

## 2013-07-19 SURGERY — POSTERIOR LUMBAR FUSION 1 LEVEL
Anesthesia: General | Site: Back

## 2013-07-19 MED ORDER — CYCLOBENZAPRINE HCL 10 MG PO TABS
10.0000 mg | ORAL_TABLET | Freq: Three times a day (TID) | ORAL | Status: DC | PRN
  Administered 2013-07-19 – 2013-07-21 (×3): 10 mg via ORAL
  Filled 2013-07-19 (×4): qty 1

## 2013-07-19 MED ORDER — GLIPIZIDE 5 MG PO TABS
5.0000 mg | ORAL_TABLET | Freq: Two times a day (BID) | ORAL | Status: DC
  Administered 2013-07-19 – 2013-07-21 (×4): 5 mg via ORAL
  Filled 2013-07-19 (×6): qty 1

## 2013-07-19 MED ORDER — CYCLOBENZAPRINE HCL 10 MG PO TABS
ORAL_TABLET | ORAL | Status: AC
  Filled 2013-07-19: qty 1

## 2013-07-19 MED ORDER — HYDROCODONE-ACETAMINOPHEN 7.5-325 MG PO TABS: 1.0000 | ORAL_TABLET | ORAL | Status: DC | PRN

## 2013-07-19 MED ORDER — PROPOFOL 10 MG/ML IV BOLUS
INTRAVENOUS | Status: AC
  Filled 2013-07-19: qty 20

## 2013-07-19 MED ORDER — ALUM & MAG HYDROXIDE-SIMETH 200-200-20 MG/5ML PO SUSP: 30.0000 mL | Freq: Four times a day (QID) | ORAL | Status: DC | PRN

## 2013-07-19 MED ORDER — BACITRACIN 50000 UNITS IM SOLR
INTRAMUSCULAR | Status: DC | PRN
  Administered 2013-07-19: 09:00:00

## 2013-07-19 MED ORDER — NEOSTIGMINE METHYLSULFATE 1 MG/ML IJ SOLN
INTRAMUSCULAR | Status: AC
  Filled 2013-07-19: qty 10

## 2013-07-19 MED ORDER — MIDAZOLAM HCL 2 MG/2ML IJ SOLN
INTRAMUSCULAR | Status: AC
  Filled 2013-07-19: qty 2

## 2013-07-19 MED ORDER — NEOSTIGMINE METHYLSULFATE 1 MG/ML IJ SOLN
INTRAMUSCULAR | Status: DC | PRN
Start: 2013-07-19 — End: 2013-07-19
  Administered 2013-07-19: 3 mg via INTRAVENOUS

## 2013-07-19 MED ORDER — HYDROMORPHONE HCL PF 1 MG/ML IJ SOLN
INTRAMUSCULAR | Status: AC
  Administered 2013-07-19: 0.5 mg via INTRAVENOUS
  Filled 2013-07-19: qty 1

## 2013-07-19 MED ORDER — ONDANSETRON HCL 4 MG/2ML IJ SOLN: 4.0000 mg | INTRAMUSCULAR | Status: DC | PRN

## 2013-07-19 MED ORDER — TRAZODONE HCL 100 MG PO TABS
100.0000 mg | ORAL_TABLET | Freq: Every day | ORAL | Status: DC
  Administered 2013-07-19 – 2013-07-20 (×2): 100 mg via ORAL
  Filled 2013-07-19 (×3): qty 1

## 2013-07-19 MED ORDER — SODIUM CHLORIDE 0.9 % IV SOLN: 250.0000 mL | INTRAVENOUS | Status: DC

## 2013-07-19 MED ORDER — LIDOCAINE HCL (CARDIAC) 20 MG/ML IV SOLN
INTRAVENOUS | Status: DC | PRN
  Administered 2013-07-19: 100 mg via INTRAVENOUS

## 2013-07-19 MED ORDER — CITALOPRAM HYDROBROMIDE 40 MG PO TABS
40.0000 mg | ORAL_TABLET | Freq: Every day | ORAL | Status: DC
  Administered 2013-07-20: 40 mg via ORAL
  Filled 2013-07-19 (×2): qty 1

## 2013-07-19 MED ORDER — LISINOPRIL-HYDROCHLOROTHIAZIDE 20-12.5 MG PO TABS: 1.0000 | ORAL_TABLET | Freq: Every day | ORAL | Status: DC

## 2013-07-19 MED ORDER — POTASSIUM CHLORIDE CRYS ER 20 MEQ PO TBCR
20.0000 meq | EXTENDED_RELEASE_TABLET | Freq: Two times a day (BID) | ORAL | Status: DC
  Administered 2013-07-19 – 2013-07-20 (×4): 20 meq via ORAL
  Filled 2013-07-19 (×7): qty 1

## 2013-07-19 MED ORDER — SENNA 8.6 MG PO TABS
1.0000 | ORAL_TABLET | Freq: Two times a day (BID) | ORAL | Status: DC
  Administered 2013-07-19 – 2013-07-20 (×4): 8.6 mg via ORAL
  Filled 2013-07-19 (×7): qty 1

## 2013-07-19 MED ORDER — GLYCOPYRROLATE 0.2 MG/ML IJ SOLN
INTRAMUSCULAR | Status: AC
  Filled 2013-07-19: qty 2

## 2013-07-19 MED ORDER — CEFAZOLIN SODIUM 1-5 GM-% IV SOLN
1.0000 g | Freq: Three times a day (TID) | INTRAVENOUS | Status: AC
  Administered 2013-07-19 (×2): 1 g via INTRAVENOUS
  Filled 2013-07-19 (×2): qty 50

## 2013-07-19 MED ORDER — HYDROCODONE-ACETAMINOPHEN 5-325 MG PO TABS: 1.0000 | ORAL_TABLET | ORAL | Status: DC | PRN

## 2013-07-19 MED ORDER — LISINOPRIL 20 MG PO TABS
20.0000 mg | ORAL_TABLET | Freq: Every day | ORAL | Status: DC
  Administered 2013-07-19: 20 mg via ORAL
  Filled 2013-07-19 (×3): qty 1

## 2013-07-19 MED ORDER — MIDAZOLAM HCL 5 MG/5ML IJ SOLN
INTRAMUSCULAR | Status: DC | PRN
  Administered 2013-07-19: 2 mg via INTRAVENOUS

## 2013-07-19 MED ORDER — OXYCODONE HCL 5 MG PO TABS
ORAL_TABLET | ORAL | Status: AC
  Filled 2013-07-19: qty 1

## 2013-07-19 MED ORDER — BISACODYL 10 MG RE SUPP: 10.0000 mg | Freq: Every day | RECTAL | Status: DC | PRN

## 2013-07-19 MED ORDER — INFLUENZA VAC SPLIT QUAD 0.5 ML IM SUSP
0.5000 mL | INTRAMUSCULAR | Status: AC
  Administered 2013-07-20: 0.5 mL via INTRAMUSCULAR
  Filled 2013-07-19: qty 0.5

## 2013-07-19 MED ORDER — OXYCODONE-ACETAMINOPHEN 5-325 MG PO TABS
1.0000 | ORAL_TABLET | ORAL | Status: DC | PRN
  Administered 2013-07-19 – 2013-07-20 (×4): 2 via ORAL
  Administered 2013-07-20: 1 via ORAL
  Administered 2013-07-20: 2 via ORAL
  Administered 2013-07-20: 1 via ORAL
  Administered 2013-07-21 (×2): 2 via ORAL
  Filled 2013-07-19 (×3): qty 2
  Filled 2013-07-19 (×2): qty 1
  Filled 2013-07-19 (×4): qty 2

## 2013-07-19 MED ORDER — OXYCODONE HCL 5 MG/5ML PO SOLN: 5.0000 mg | Freq: Once | ORAL | Status: AC | PRN

## 2013-07-19 MED ORDER — 0.9 % SODIUM CHLORIDE (POUR BTL) OPTIME
TOPICAL | Status: DC | PRN
  Administered 2013-07-19: 1000 mL

## 2013-07-19 MED ORDER — ACETAMINOPHEN 650 MG RE SUPP: 650.0000 mg | RECTAL | Status: DC | PRN

## 2013-07-19 MED ORDER — OXYCODONE HCL 5 MG PO TABS
5.0000 mg | ORAL_TABLET | Freq: Once | ORAL | Status: AC | PRN
  Administered 2013-07-19: 5 mg via ORAL

## 2013-07-19 MED ORDER — ROCURONIUM BROMIDE 50 MG/5ML IV SOLN
INTRAVENOUS | Status: AC
  Filled 2013-07-19: qty 1

## 2013-07-19 MED ORDER — LACTATED RINGERS IV SOLN
INTRAVENOUS | Status: DC | PRN
  Administered 2013-07-19 (×2): via INTRAVENOUS

## 2013-07-19 MED ORDER — LIDOCAINE HCL (CARDIAC) 20 MG/ML IV SOLN
INTRAVENOUS | Status: AC
  Filled 2013-07-19: qty 5

## 2013-07-19 MED ORDER — MENTHOL 3 MG MT LOZG: 1.0000 | LOZENGE | OROMUCOSAL | Status: DC | PRN

## 2013-07-19 MED ORDER — METFORMIN HCL 500 MG PO TABS: 1000.0000 mg | ORAL_TABLET | Freq: Two times a day (BID) | ORAL | Status: DC

## 2013-07-19 MED ORDER — ONDANSETRON HCL 4 MG/2ML IJ SOLN
INTRAMUSCULAR | Status: AC
  Filled 2013-07-19: qty 2

## 2013-07-19 MED ORDER — SUFENTANIL CITRATE 50 MCG/ML IV SOLN
INTRAVENOUS | Status: DC | PRN
  Administered 2013-07-19: 20 ug via INTRAVENOUS
  Administered 2013-07-19: 10 ug via INTRAVENOUS

## 2013-07-19 MED ORDER — PROPOFOL 10 MG/ML IV BOLUS
INTRAVENOUS | Status: DC | PRN
  Administered 2013-07-19: 50 mg via INTRAVENOUS
  Administered 2013-07-19: 200 mg via INTRAVENOUS

## 2013-07-19 MED ORDER — SODIUM CHLORIDE 0.9 % IJ SOLN
3.0000 mL | Freq: Two times a day (BID) | INTRAMUSCULAR | Status: DC
  Administered 2013-07-19 – 2013-07-20 (×3): 3 mL via INTRAVENOUS

## 2013-07-19 MED ORDER — SUFENTANIL CITRATE 50 MCG/ML IV SOLN
INTRAVENOUS | Status: AC
  Filled 2013-07-19: qty 1

## 2013-07-19 MED ORDER — BUPIVACAINE HCL (PF) 0.25 % IJ SOLN
INTRAMUSCULAR | Status: DC | PRN
  Administered 2013-07-19: 20 mL

## 2013-07-19 MED ORDER — POLYETHYLENE GLYCOL 3350 17 G PO PACK
17.0000 g | PACK | Freq: Every day | ORAL | Status: DC | PRN
  Filled 2013-07-19: qty 1

## 2013-07-19 MED ORDER — AMITRIPTYLINE HCL 50 MG PO TABS
50.0000 mg | ORAL_TABLET | Freq: Every day | ORAL | Status: DC
  Administered 2013-07-19 – 2013-07-20 (×2): 50 mg via ORAL
  Filled 2013-07-19 (×3): qty 1

## 2013-07-19 MED ORDER — THROMBIN 20000 UNITS EX SOLR
CUTANEOUS | Status: DC | PRN
  Administered 2013-07-19: 09:00:00 via TOPICAL

## 2013-07-19 MED ORDER — ROCURONIUM BROMIDE 100 MG/10ML IV SOLN
INTRAVENOUS | Status: DC | PRN
  Administered 2013-07-19: 50 mg via INTRAVENOUS

## 2013-07-19 MED ORDER — ONDANSETRON HCL 4 MG/2ML IJ SOLN
INTRAMUSCULAR | Status: DC | PRN
  Administered 2013-07-19: 4 mg via INTRAVENOUS

## 2013-07-19 MED ORDER — PHENOL 1.4 % MT LIQD: 1.0000 | OROMUCOSAL | Status: DC | PRN

## 2013-07-19 MED ORDER — METOCLOPRAMIDE HCL 5 MG/ML IJ SOLN: 10.0000 mg | Freq: Once | INTRAMUSCULAR | Status: DC | PRN

## 2013-07-19 MED ORDER — FLEET ENEMA 7-19 GM/118ML RE ENEM
1.0000 | ENEMA | Freq: Once | RECTAL | Status: AC | PRN
  Filled 2013-07-19: qty 1

## 2013-07-19 MED ORDER — HYDROMORPHONE HCL PF 1 MG/ML IJ SOLN
0.2500 mg | INTRAMUSCULAR | Status: DC | PRN
  Administered 2013-07-19 (×4): 0.5 mg via INTRAVENOUS

## 2013-07-19 MED ORDER — HYDROCHLOROTHIAZIDE 12.5 MG PO CAPS
12.5000 mg | ORAL_CAPSULE | Freq: Every day | ORAL | Status: DC
  Administered 2013-07-19: 12.5 mg via ORAL
  Filled 2013-07-19 (×3): qty 1

## 2013-07-19 MED ORDER — SODIUM CHLORIDE 0.9 % IJ SOLN
INTRAMUSCULAR | Status: AC
  Filled 2013-07-19: qty 10

## 2013-07-19 MED ORDER — ACETAMINOPHEN 325 MG PO TABS: 650.0000 mg | ORAL_TABLET | ORAL | Status: DC | PRN

## 2013-07-19 MED ORDER — SODIUM CHLORIDE 0.9 % IJ SOLN: 3.0000 mL | INTRAMUSCULAR | Status: DC | PRN

## 2013-07-19 MED ORDER — METOPROLOL TARTRATE 50 MG PO TABS
50.0000 mg | ORAL_TABLET | Freq: Two times a day (BID) | ORAL | Status: DC
  Filled 2013-07-19 (×5): qty 1

## 2013-07-19 MED ORDER — SUCCINYLCHOLINE CHLORIDE 20 MG/ML IJ SOLN
INTRAMUSCULAR | Status: AC
  Filled 2013-07-19: qty 1

## 2013-07-19 MED ORDER — CLONAZEPAM 0.5 MG PO TABS
0.5000 mg | ORAL_TABLET | Freq: Three times a day (TID) | ORAL | Status: DC | PRN
  Administered 2013-07-19 – 2013-07-21 (×2): 0.5 mg via ORAL
  Filled 2013-07-19 (×2): qty 1

## 2013-07-19 MED ORDER — SIMVASTATIN 20 MG PO TABS
20.0000 mg | ORAL_TABLET | Freq: Every day | ORAL | Status: DC
  Administered 2013-07-19 – 2013-07-20 (×2): 20 mg via ORAL
  Filled 2013-07-19 (×3): qty 1

## 2013-07-19 MED ORDER — IBUPROFEN 800 MG PO TABS
800.0000 mg | ORAL_TABLET | Freq: Three times a day (TID) | ORAL | Status: DC | PRN
  Filled 2013-07-19: qty 1

## 2013-07-19 MED ORDER — HYDROMORPHONE HCL PF 1 MG/ML IJ SOLN
0.5000 mg | INTRAMUSCULAR | Status: DC | PRN
  Administered 2013-07-20: 1 mg via INTRAVENOUS
  Filled 2013-07-19: qty 1

## 2013-07-19 MED ORDER — GLYCOPYRROLATE 0.2 MG/ML IJ SOLN
INTRAMUSCULAR | Status: DC | PRN
  Administered 2013-07-19: 0.4 mg via INTRAVENOUS

## 2013-07-19 SURGICAL SUPPLY — 75 items
ADH SKN CLS APL DERMABOND .7 (GAUZE/BANDAGES/DRESSINGS) ×1
ADH SKN CLS LQ APL DERMABOND (GAUZE/BANDAGES/DRESSINGS) ×1
APL SKNCLS STERI-STRIP NONHPOA (GAUZE/BANDAGES/DRESSINGS) ×1
BAG DECANTER FOR FLEXI CONT (MISCELLANEOUS) ×3 IMPLANT
BENZOIN TINCTURE PRP APPL 2/3 (GAUZE/BANDAGES/DRESSINGS) ×3 IMPLANT
BLADE SURG ROTATE 9660 (MISCELLANEOUS) IMPLANT
BRUSH SCRUB EZ PLAIN DRY (MISCELLANEOUS) ×3 IMPLANT
BUR MATCHSTICK NEURO 3.0 LAGG (BURR) ×3 IMPLANT
CAGE CAPSTONE BULLET 8X22 (Cage) ×2 IMPLANT
CANISTER SUCT 3000ML (MISCELLANEOUS) ×3 IMPLANT
CAP LCK SPNE (Orthopedic Implant) ×4 IMPLANT
CAP LOCK SPINE RADIUS (Orthopedic Implant) IMPLANT
CAP LOCKING (Orthopedic Implant) ×12 IMPLANT
CLOSURE WOUND 1/2 X4 (GAUZE/BANDAGES/DRESSINGS) ×1
CONT SPEC 4OZ CLIKSEAL STRL BL (MISCELLANEOUS) ×6 IMPLANT
COVER BACK TABLE 24X17X13 BIG (DRAPES) IMPLANT
COVER TABLE BACK 60X90 (DRAPES) ×3 IMPLANT
DECANTER SPIKE VIAL GLASS SM (MISCELLANEOUS) ×3 IMPLANT
DERMABOND ADHESIVE PROPEN (GAUZE/BANDAGES/DRESSINGS) ×2
DERMABOND ADVANCED (GAUZE/BANDAGES/DRESSINGS) ×2
DERMABOND ADVANCED .7 DNX12 (GAUZE/BANDAGES/DRESSINGS) ×1 IMPLANT
DERMABOND ADVANCED .7 DNX6 (GAUZE/BANDAGES/DRESSINGS) IMPLANT
DRAPE C-ARM 42X72 X-RAY (DRAPES) ×6 IMPLANT
DRAPE LAPAROTOMY 100X72X124 (DRAPES) ×3 IMPLANT
DRAPE POUCH INSTRU U-SHP 10X18 (DRAPES) ×3 IMPLANT
DRAPE PROXIMA HALF (DRAPES) IMPLANT
DRAPE SURG 17X23 STRL (DRAPES) ×12 IMPLANT
DRSG OPSITE POSTOP 4X8 (GAUZE/BANDAGES/DRESSINGS) ×2 IMPLANT
DURAPREP 26ML APPLICATOR (WOUND CARE) ×3 IMPLANT
ELECT REM PT RETURN 9FT ADLT (ELECTROSURGICAL) ×3
ELECTRODE REM PT RTRN 9FT ADLT (ELECTROSURGICAL) ×1 IMPLANT
EVACUATOR 1/8 PVC DRAIN (DRAIN) ×3 IMPLANT
GAUZE SPONGE 4X4 16PLY XRAY LF (GAUZE/BANDAGES/DRESSINGS) ×2 IMPLANT
GLOVE BIOGEL PI IND STRL 8.5 (GLOVE) IMPLANT
GLOVE BIOGEL PI INDICATOR 8.5 (GLOVE) ×2
GLOVE ECLIPSE 8.5 STRL (GLOVE) ×2 IMPLANT
GLOVE EXAM NITRILE LRG STRL (GLOVE) ×2 IMPLANT
GLOVE EXAM NITRILE MD LF STRL (GLOVE) ×2 IMPLANT
GLOVE EXAM NITRILE XL STR (GLOVE) IMPLANT
GLOVE EXAM NITRILE XS STR PU (GLOVE) IMPLANT
GLOVE INDICATOR 7.5 STRL GRN (GLOVE) ×10 IMPLANT
GLOVE SURG SS PI 7.5 STRL IVOR (GLOVE) ×6 IMPLANT
GLOVE SURG SS PI 8.5 STRL IVOR (GLOVE) ×6
GLOVE SURG SS PI 8.5 STRL STRW (GLOVE) IMPLANT
GOWN BRE IMP SLV AUR LG STRL (GOWN DISPOSABLE) IMPLANT
GOWN BRE IMP SLV AUR XL STRL (GOWN DISPOSABLE) ×2 IMPLANT
GOWN STRL REIN 2XL LVL4 (GOWN DISPOSABLE) IMPLANT
GOWN STRL REUS W/ TWL LRG LVL3 (GOWN DISPOSABLE) IMPLANT
GOWN STRL REUS W/ TWL XL LVL3 (GOWN DISPOSABLE) IMPLANT
GOWN STRL REUS W/TWL 2XL LVL3 (GOWN DISPOSABLE) ×4 IMPLANT
GOWN STRL REUS W/TWL LRG LVL3 (GOWN DISPOSABLE) ×6
GOWN STRL REUS W/TWL XL LVL3 (GOWN DISPOSABLE) ×6
KIT BASIN OR (CUSTOM PROCEDURE TRAY) ×3 IMPLANT
KIT ROOM TURNOVER OR (KITS) ×3 IMPLANT
MILL MEDIUM DISP (BLADE) ×2 IMPLANT
NEEDLE HYPO 22GX1.5 SAFETY (NEEDLE) ×3 IMPLANT
NS IRRIG 1000ML POUR BTL (IV SOLUTION) ×3 IMPLANT
PACK LAMINECTOMY NEURO (CUSTOM PROCEDURE TRAY) ×3 IMPLANT
ROD RADIUS 35MM (Rod) ×4 IMPLANT
SCREW 6.75X35MM (Screw) ×4 IMPLANT
SCREW 6.75X40MM (Screw) ×2 IMPLANT
SPONGE GAUZE 4X4 12PLY (GAUZE/BANDAGES/DRESSINGS) ×3 IMPLANT
SPONGE SURGIFOAM ABS GEL 100 (HEMOSTASIS) ×3 IMPLANT
STRIP CLOSURE SKIN 1/2X4 (GAUZE/BANDAGES/DRESSINGS) ×3 IMPLANT
SUT VIC AB 0 CT1 18XCR BRD8 (SUTURE) ×2 IMPLANT
SUT VIC AB 0 CT1 8-18 (SUTURE) ×6
SUT VIC AB 2-0 CT1 18 (SUTURE) ×3 IMPLANT
SUT VIC AB 3-0 SH 8-18 (SUTURE) ×6 IMPLANT
SYR 20ML ECCENTRIC (SYRINGE) ×3 IMPLANT
TAPE CLOTH SURG 4X10 WHT LF (GAUZE/BANDAGES/DRESSINGS) ×2 IMPLANT
TOWEL OR 17X24 6PK STRL BLUE (TOWEL DISPOSABLE) ×3 IMPLANT
TOWEL OR 17X26 10 PK STRL BLUE (TOWEL DISPOSABLE) ×3 IMPLANT
TRAY FOLEY CATH 14FRSI W/METER (CATHETERS) ×3 IMPLANT
WATER STERILE IRR 1000ML POUR (IV SOLUTION) ×3 IMPLANT
WEDGE TANGENT 8X26MM ×2 IMPLANT

## 2013-07-19 NOTE — Anesthesia Procedure Notes (Signed)
Procedure Name: Intubation Date/Time: 07/19/2013 7:46 AM Performed by: Charm BargesBUTLER, Matia Zelada R Pre-anesthesia Checklist: Patient identified, Emergency Drugs available, Suction available, Patient being monitored and Timeout performed Patient Re-evaluated:Patient Re-evaluated prior to inductionOxygen Delivery Method: Circle system utilized Preoxygenation: Pre-oxygenation with 100% oxygen Intubation Type: IV induction Ventilation: Mask ventilation without difficulty Laryngoscope Size: Mac and 3 Grade View: Grade III Tube type: Oral Tube size: 7.5 mm Number of attempts: 2 Airway Equipment and Method: Stylet and Video-laryngoscopy (Grade 3 view with Mac 3 blade, NO EtCO2, ETT out, easy mask vent, while video-laryngoscope obtained, grade 2 view with video-laryngoscopy, atraumatic intubation with positive EtCO2 and equal bilateral breath sounds.) Placement Confirmation: ETT inserted through vocal cords under direct vision,  positive ETCO2 and breath sounds checked- equal and bilateral Secured at: 21 cm Tube secured with: Tape Dental Injury: Teeth and Oropharynx as per pre-operative assessment  Future Recommendations: Recommend- induction with short-acting agent, and alternative techniques readily available

## 2013-07-19 NOTE — Evaluation (Signed)
Physical Therapy Evaluation Patient Details Name: Betty Fuentes MRN: [id number] DOB: May 24, 1960 Today's Date: 07/19/2013 Time: [id number] PT Time Calculation (min): 27 min  PT Assessment / Plan / Recommendation History of Present Illness  Patient is s/p LUMBAR FOUR TO FIVE POSTERIOR LUMBAR INTERBODY FUSION 1 LEVEL   Clinical Impression  Patient demonstrates deficits as indicated below. Will need continued skilled PT to address deficits and maximize function. Will continue to see as indicated and progress as tolerated. Patient has no family or friend that are able to assist upon discharge, there from recommend [facility].    PT Assessment  Patient needs continued PT services    Follow Up Recommendations  SNF;Supervision/Assistance - 24 hour    Does the patient have the potential to tolerate intense rehabilitation      Barriers to Discharge Inaccessible home environment;Decreased caregiver support stairs to enter, no assist available upon discharge    Equipment Recommendations  Rolling walker with 5" wheels;3in1 (PT)    Recommendations for Other Services     Frequency Min 5X/week    Precautions / Restrictions Precautions Precautions: Back Precaution Booklet Issued: Yes (comment) Precaution Comments: educated and reviewed precautions extensively with patient Required Braces or Orthoses: Spinal Brace Spinal Brace: Lumbar corset;Applied in sitting position Restrictions Weight Bearing Restrictions: No   Pertinent Vitals/Pain 6/10      Mobility  Bed Mobility Overal bed mobility: Needs Assistance Bed Mobility: Rolling;Sidelying to Sit;Sit to Sidelying Rolling: Min guard Sidelying to sit: Min assist Sit to sidelying: Min assist General bed mobility comments: VCs for positioning Transfers Overall transfer level: Needs assistance Equipment used: Rolling walker (2 wheeled) Transfers: Sit to/from Stand Sit to Stand: Min assist General transfer comment: VCs for hand placement  and technique, cues for upright posture Ambulation/Gait Ambulation/Gait assistance: Min guard Ambulation Distance (Feet): 180 Feet Assistive device: Rolling walker (2 wheeled) Gait Pattern/deviations: Step-through pattern;Decreased stride length;Trunk flexed Gait velocity: decreased Gait velocity interpretation: Below normal speed for age/gender General Gait Details: steady with ambulation, VCs for normalized gait and cadence, Cues for upright posture while ambulating    Exercises     PT Diagnosis: Difficulty walking;Abnormality of gait;Generalized weakness;Acute pain  PT Problem List: Decreased strength;Decreased range of motion;Decreased activity tolerance;Decreased balance;Decreased mobility;Decreased knowledge of use of DME;Obesity;Pain PT Treatment Interventions: DME instruction;Gait training;Stair training;Functional mobility training;Therapeutic activities;Therapeutic exercise;Balance training;Patient/family education     PT Goals(Current goals can be found in the care plan section) Acute Rehab PT Goals Patient Stated Goal: to be independent PT Goal Formulation: With patient Time For Goal Achievement: 08/02/13 Potential to Achieve Goals: Good  Visit Information  Last PT Received On: 07/19/13 Assistance Needed: +1 History of Present Illness: Patient is s/p LUMBAR FOUR TO FIVE POSTERIOR LUMBAR INTERBODY FUSION 1 LEVEL        Prior Functioning  Home Living Family/patient expects to be discharged to:: Private residence Living Arrangements: Alone Type of Home: Mobile home Home Access: Stairs to enter Secretary/administratorntrance Stairs-Number of Steps: 3 Entrance Stairs-Rails: Right Home Layout: One level Home Equipment: None Additional Comments: garden tub shower, standard height toilets Prior Function Level of Independence: Independent Communication Communication: No difficulties Dominant Hand: Right    Cognition  Cognition Arousal/Alertness: Awake/alert Behavior During Therapy:  WFL for tasks assessed/performed Overall Cognitive Status: Within Functional Limits for tasks assessed    Extremity/Trunk Assessment Upper Extremity Assessment Upper Extremity Assessment: Overall WFL for tasks assessed Lower Extremity Assessment Lower Extremity Assessment: Generalized weakness   Balance General Comments General comments (skin integrity, edema, etc.):  educated extensively on back precautions and mobility expectations. Instructed on how to don/doff brace   End of Session PT - End of Session Equipment Utilized During Treatment: Gait belt;Back brace Activity Tolerance: Patient tolerated treatment well Patient left: in bed;with call bell/phone within reach Nurse Communication: Mobility status  GP     Fabio Asa 07/19/2013, 3:32 PM Charlotte Crumb, PT DPT  [phone]

## 2013-07-19 NOTE — Preoperative (Signed)
Beta Blockers   Reason not to administer Beta Blockers:Metoprolol taken by patient at 0330 hrs on 07/19/2013
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
## 2013-07-19 NOTE — Anesthesia Preprocedure Evaluation (Addendum)
Anesthesia Evaluation  Patient identified by MRN, date of birth, ID band Patient awake    Reviewed: Allergy & Precautions, H&P , NPO status , Patient's Chart, lab work & pertinent test results, reviewed documented beta blocker date and time   Airway Mallampati: II TM Distance: >3 FB Neck ROM: full    Dental  (+) Teeth Intact, Dental Advisory Given   Pulmonary neg pulmonary ROS, shortness of breath and with exertion,  breath sounds clear to auscultation        Cardiovascular hypertension, On Medications and On Home Beta Blockers Rhythm:regular     Neuro/Psych PSYCHIATRIC DISORDERS Anxiety Depression  Neuromuscular disease    GI/Hepatic negative GI ROS, Neg liver ROS,   Endo/Other  diabetes, Oral Hypoglycemic Agents  Renal/GU Renal InsufficiencyRenal disease  negative genitourinary   Musculoskeletal   Abdominal   Peds  Hematology negative hematology ROS (+) anemia ,   Anesthesia Other Findings See surgeon's H&P   Reproductive/Obstetrics negative OB ROS                          Anesthesia Physical Anesthesia Plan  ASA: III  Anesthesia Plan: General   Post-op Pain Management:    Induction: Intravenous  Airway Management Planned: Oral ETT  Additional Equipment: None  Intra-op Plan:   Post-operative Plan: Extubation in OR  Informed Consent: I have reviewed the patients History and Physical, chart, labs and discussed the procedure including the risks, benefits and alternatives for the proposed anesthesia with the patient or authorized representative who has indicated his/her understanding and acceptance.   Dental Advisory Given and Dental advisory given  Plan Discussed with: CRNA, Surgeon and Anesthesiologist  Anesthesia Plan Comments:        Anesthesia Quick Evaluation

## 2013-07-19 NOTE — Brief Op Note (Signed)
07/19/2013  9:30 AM  PATIENT:  Betty Fuentes  53 y.o. female  PRE-OPERATIVE DIAGNOSIS:  spondylolisthesis  POST-OPERATIVE DIAGNOSIS:  spondylolisthesis  PROCEDURE:  Procedure(s): LUMBAR FOUR TO FIVE POSTERIOR LUMBAR INTERBODY FUSION 1 LEVEL (N/A)  SURGEON:  Surgeon(s) and Role:    * Temple PaciniHenry A Remiel Corti, MD - Primary  PHYSICIAN ASSISTANT:   ASSISTANTS: Elsner   ANESTHESIA:   general  EBL:  Total I/O In: -  Out: 150 [Blood:150]  BLOOD ADMINISTERED:none  DRAINS: (Medium) Hemovact drain(s) in the Epidural space with  Suction Open   LOCAL MEDICATIONS USED:  MARCAINE     SPECIMEN:  No Specimen  DISPOSITION OF SPECIMEN:  N/A  COUNTS:  YES  TOURNIQUET:  * No tourniquets in log *  DICTATION: .Dragon Dictation  PLAN OF CARE: Admit to inpatient   PATIENT DISPOSITION:  PACU - hemodynamically stable.   Delay start of Pharmacological VTE agent (>24hrs) due to surgical blood loss or risk of bleeding: yes

## 2013-07-19 NOTE — Op Note (Signed)
Date of procedure: 07/19/2013  Date of dictation: Same  Service: Neurosurgery  Preoperative diagnosis: L4-5 grade 1 degenerative spondylolisthesis with stenosis  Postoperative diagnosis: Same  Procedure Name: L4-5 decompressive laminectomy with bilateral L4 and L5 decompressive foraminotomies, more than would be required for simple interbody fusion alone.  L4-5 posterior lumbar interbody fusion utilizing tangent interbody allograft wedge, Telamon interbody peek cage, and local autograft.  Posterior lateral arthrodesis was then nonsegmental pedicle screw instrumentation and local autograft.   Surgeon:Areeb Corron A.Erhard Senske, M.D.  Asst. Surgeon: Danielle DessElsner  Anesthesia: General  Indication: 53 year old female with back and lower extremity symptoms consistent with radiculopathy and neurogenic claudication. Workup demonstrates evidence of marked lumbar stenosis at L4-5 complicated by degenerative spondylolisthesis with significant facet arthropathy. Patient presents now for decompression and fusion surgery in hopes of improving her symptoms.  Operative note: After induction of anesthesia, patient positioned prone onto Wilson frame and appropriately padded. Lumbar region prepped and draped. Incision made overlying L5-S1. Supper off dissection performed bilaterally. Retractor placed. Fluoroscopy used. Levels confirmed. Decompressive laminectomy performed using Leksell rongeurs Kerrison years high-speed drill to remove the entire lamina of L4 entire facets and inferior facets of L4. Superior facets of L5. In the superior aspect of lamina of L5. All bone is cleaned in use and later autografting. Ligament flavum was elevated and resected piecemeal fashion using Kerrison rongeurs. Underlying thecal sac was then applied. Wide decompressive foraminotomies were performed on of course exiting L4 and L5 nerve roots bilaterally. Bilateral discectomies performed at L4-5. The space distracted 8 mm. Within a millimeter  distractor less than patient's left side. Thecal sac and nerve roots were protected on the right side. The spaces and reamed and then cut with 8 mm tangent instruments. Soft tissue was removed and interspace. An 8 x 22 mm Telamon cage packed with morselized autograft was impacted in place recessed proximally 1-2 mm and posterior cortical margin. Distractor was removed patient's left side. Thecal sac and nerve roots protected the left side. The space was again reamed and then cut with 8 mm tangent instruments. Soft tissue was removed and interspace. The space was further curettage. Morselize autograft was packed in the interspace for later fusion. An 8 x 26 over tangent allograft wedge was then impacted in place and recessed roughly 1-2 mm and posterior cortical margin of L4. Pedicles of L4 and L5 were then identified using surface landmarks and intraoperative fluoroscopy is age pedicle was then probed using pedicle awl each pedicle awl track was then tapped with a 5.25 m screw tap. Each screw tap hole was probed and found to be solidly within the bone. 6.75 mm radius brand screws from [employer] were used bilaterally. 40 mm screws placed at L4. 35 mm screws placed at L5. Transverse processes of L4 and L5 were decorticated high-speed drill. Morselized autograft was packed posterior laterally for later fusion. Short segment titanium rod was placed over the screw heads. Locking caps and placed over the screws were locking caps and engaged with the construct under compression. Final images revealed good position the rest and hardware proper level with normal alignment i of the spine. Wound is irrigated one final time. Wounds and close in layers with Vicryl sutures. Medium Hemovac drain was left at per space. There were no apparent complications. Patient tolerated the procedure well. He returns to the recovery postop.

## 2013-07-19 NOTE — Progress Notes (Signed)
PHARMACIST - PHYSICIAN COMMUNICATION DR:  Jordan LikesPool CONCERNING:  METFORMIN SAFE ADMINISTRATION POLICY  RECOMMENDATION: Metformin has been placed on DISCONTINUE (rejected order) STATUS and should be reordered only after any of the conditions below are ruled out.  Current safety recommendations include avoiding metformin for a minimum of 48 hours after the patient's exposure to intravenous contrast media.  DESCRIPTION:  The Pharmacy Committee has adopted a policy that restricts the use of metformin in hospitalized patients until all the contraindications to administration have been ruled out. Specific contraindications are: []  Serum creatinine ? 1.5 for males [x]  Serum creatinine ? 1.4 for females []  Shock, acute MI, sepsis, hypoxemia, dehydration []  Planned administration of intravenous iodinated contrast media []  Heart Failure patients with low EF []  Acute or chronic metabolic acidosis (including DKA)     *Last Scr monitored was on 07/08/13 and was 1.42. Metformin has been discontinued. Please consider rechecking a BMET to re-evaluate SCr.   Thanks, Link SnufferJessica Kaegan Stigler, PharmD, BCPS Clinical Pharmacist [phone] 07/19/2013, 11:25 AM

## 2013-07-19 NOTE — Anesthesia Postprocedure Evaluation (Signed)
Anesthesia Post Note  Patient: Betty Fuentes  Procedure(s) Performed: Procedure(s) (LRB): LUMBAR FOUR TO FIVE POSTERIOR LUMBAR INTERBODY FUSION 1 LEVEL (N/A)  Anesthesia type: General  Patient location: PACU  Post pain: Pain level controlled  Post assessment: Patient's Cardiovascular Status Stable  Last Vitals:  Filed Vitals:   07/19/13 1059  BP:   Pulse:   Temp: 36.8 C  Resp:     Post vital signs: Reviewed and stable  Level of consciousness: alert  Complications: No apparent anesthesia complications

## 2013-07-19 NOTE — Evaluation (Signed)
Occupational Therapy Evaluation Patient Details Name: Betty FrederickBrenda M Servidio MRN: [id number] DOB: 26-Aug-1960 Today's Date: 07/19/2013 Time: [id number] OT Time Calculation (min): 24 min  OT Assessment / Plan / Recommendation History of present illness Patient is s/p LUMBAR FOUR TO FIVE POSTERIOR LUMBAR INTERBODY FUSION 1 LEVEL    Clinical Impression   Pt presents with below problem list. Pt independent with ADLs, PTA. Feel pt will benefit from acute OT to increase independence prior to d/c. Recommending SNF for additional rehab as pt states she does not have anyone who can assist 24/7.     OT Assessment  Patient needs continued OT Services    Follow Up Recommendations  SNF;Supervision/Assistance - 24 hour    Barriers to Discharge Decreased caregiver support    Equipment Recommendations  3 in 1 bedside comode;Other (comment) (AE)    Recommendations for Other Services    Frequency  Min 2X/week    Precautions / Restrictions Precautions Precautions: Back Precaution Booklet Issued:  (one in room) Precaution Comments: Reviewed precautions with pt Required Braces or Orthoses: Spinal Brace Spinal Brace: Lumbar corset;Applied in sitting position Restrictions Weight Bearing Restrictions: No   Pertinent Vitals/Pain Pain 8/10. Repositioned. Increased activity during session.    ADL  Grooming: Wash/dry face;Teeth care;Minimal assistance (balance) Where Assessed - Grooming: Supported standing;Unsupported standing Upper Body Dressing: Minimal assistance (brace) Where Assessed - Upper Body Dressing: Unsupported sitting Lower Body Dressing: Moderate assistance Where Assessed - Lower Body Dressing: Supported sit to stand Toilet Transfer: Hydrographic surveyorMin guard Toilet Transfer Method: Sit to [place] Toilet Transfer Equipment:  (bed) Tub/Shower Transfer Method: Not assessed Equipment Used: Gait belt;Back brace;Rolling walker;Reacher;Long-handled sponge;Long-handled shoe horn;Sock aid Transfers/Ambulation  Related to ADLs: Min guard ADL Comments: Educated on AE for LB ADLs as well as toilet aid if needed. Pt practiced with reacher and sockaid. Educated on use of cups for teeth care and placement of grooming items to avoid breaking precautions. Discussed use of bag on walker and safe shoewear.    OT Diagnosis: Acute pain  OT Problem List: Decreased strength;Decreased activity tolerance;Impaired balance (sitting and/or standing);Decreased knowledge of use of DME or AE;Decreased knowledge of precautions;Pain;Decreased range of motion OT Treatment Interventions: Self-care/ADL training;DME and/or AE instruction;Therapeutic activities;Patient/family education;Balance training   OT Goals(Current goals can be found in the care plan section) Acute Rehab OT Goals Patient Stated Goal: not stated OT Goal Formulation: With patient Time For Goal Achievement: 07/26/13 Potential to Achieve Goals: Good ADL Goals Pt Will Perform Grooming: with modified independence;standing Pt Will Perform Lower Body Dressing: with modified independence;with adaptive equipment;sit to/from stand Pt Will Transfer to Toilet: with modified independence;ambulating (3 in 1 over commode) Pt Will Perform Toileting - Clothing Manipulation and hygiene: with modified independence;sit to/from stand Additional ADL Goal #1: Pt will independently verbalize and demonstrate 3/3 back precautions.   Visit Information  Last OT Received On: 07/19/13 Assistance Needed: +1 History of Present Illness: Patient is s/p LUMBAR FOUR TO FIVE POSTERIOR LUMBAR INTERBODY FUSION 1 LEVEL        Prior Functioning     Home Living Family/patient expects to be discharged to:: Private residence Living Arrangements: Alone Type of Home: Mobile home Home Access: Stairs to enter Secretary/administratorntrance Stairs-Number of Steps: 3 Entrance Stairs-Rails: Right Home Layout: One level Home Equipment: None Additional Comments: garden tub shower, standard height toilets Prior  Function Level of Independence: Independent Communication Communication: No difficulties Dominant Hand: Left         Vision/Perception     Cognition  Cognition Arousal/Alertness: Awake/alert  Behavior During Therapy: WFL for tasks assessed/performed Overall Cognitive Status: Within Functional Limits for tasks assessed    Extremity/Trunk Assessment Upper Extremity Assessment Upper Extremity Assessment: Overall WFL for tasks assessed Lower Extremity Assessment Lower Extremity Assessment: Defer to PT evaluation     Mobility Bed Mobility Overal bed mobility: Needs Assistance Bed Mobility: Rolling;Sidelying to Sit;Sit to Sidelying Rolling: Min guard Sidelying to sit: Min assist Sit to sidelying: Min guard General bed mobility comments: Assistance with trunk. Cues for technique. +2 Total A to scoot to [facility] and [place] position used. Transfers Overall transfer level: Needs assistance Equipment used: Rolling walker (2 wheeled) Transfers: Sit to/from Stand Sit to Stand: Min guard General transfer comment: cues for hand placement.     Exercise        End of Session OT - End of Session Equipment Utilized During Treatment: Gait belt;Rolling walker;Back brace Activity Tolerance: Patient tolerated treatment well Patient left: in bed;with nursing/sitter in room  GO     Earlie Raveling OTR/L [phone] 07/19/2013, 4:37 PM

## 2013-07-19 NOTE — Transfer of Care (Signed)
Immediate Anesthesia Transfer of Care Note  Patient: Betty FrederickBrenda M Howlett  Procedure(s) Performed: Procedure(s): LUMBAR FOUR TO FIVE POSTERIOR LUMBAR INTERBODY FUSION 1 LEVEL (N/A)  Patient Location: PACU  Anesthesia Type:General  Level of Consciousness: awake, alert , oriented and patient cooperative  Airway & Oxygen Therapy: Patient Spontanous Breathing and Patient connected to nasal cannula oxygen  Post-op Assessment: Report given to PACU RN, Post -op Vital signs reviewed and stable and Patient moving all extremities X 4  Post vital signs: Reviewed and stable  Complications: No apparent anesthesia complications

## 2013-07-19 NOTE — Progress Notes (Signed)
Utilization review completed.  

## 2013-07-19 NOTE — Progress Notes (Signed)
Orthopedic Tech Progress Note Patient Details:  Betty Fuentes 06/22/1960 [id number] Spoke with patient's nurse; patient has brace already. No action needed form Ortho Tech at this time. Patient ID: Betty Fuentes, female   DOB: 06/22/1960, 53 y.o.   MRN: [id number]   [name] 07/19/2013, 11:52 AM

## 2013-07-20 LAB — GLUCOSE, CAPILLARY
GLUCOSE-CAPILLARY: 216 mg/dL — AB (ref 70–99)
GLUCOSE-CAPILLARY: 236 mg/dL — AB (ref 70–99)
GLUCOSE-CAPILLARY: 185 mg/dL — AB (ref 70–99)
GLUCOSE-CAPILLARY: 193 mg/dL — AB (ref 70–99)

## 2013-07-20 MED FILL — Heparin Sodium (Porcine) Inj 1000 Unit/ML: INTRAMUSCULAR | Qty: 30 | Status: AC

## 2013-07-20 MED FILL — Sodium Chloride IV Soln 0.9%: INTRAVENOUS | Qty: 1000 | Status: AC

## 2013-07-20 NOTE — Progress Notes (Signed)
Postop day 1. Patient doing fairly well. Pain well controlled. Able to mobilize with therapy today. Preoperative lower extremity symptoms improved. No new pain or problems.  Afebrile. Vitals stable albeit with some mild expected hypertension. Urine output good. Drain output minimal. Awake and alert. Oriented and appropriate. Motor and sensory exam intact. Chest and abdomen benign. Dressing dry.  Doing well postop. Continue efforts at mobilization. Probable discharge tomorrow

## 2013-07-20 NOTE — Progress Notes (Addendum)
Occupational Therapy Treatment Patient Details Name: Betty FrederickBrenda M Fuentes MRN: [id number] DOB: 11-Sep-1960 Today's Date: 07/20/2013    History of present illness Patient is s/p LUMBAR FOUR TO FIVE POSTERIOR LUMBAR INTERBODY FUSION 1 LEVEL    OT comments  Pt progressing towards goals. Education provided during session. Pt has friend planning to help her at home.   Follow Up Recommendations  Home health OT; Supervision-Intermittent   Equipment Recommendations  3 in 1 bedside comode;Tub/shower seat;Other (comment) (AE)    Recommendations for Other Services      Precautions / Restrictions Precautions Precautions: Back Precaution Comments: Reviewed precautions with pt Required Braces or Orthoses: Spinal Brace Spinal Brace: Lumbar corset;Applied in sitting position       Mobility Bed Mobility Overal bed mobility: Needs Assistance Bed Mobility: Sidelying to Sit;Sit to Sidelying   Sidelying to sit: Supervision     Sit to sidelying: Supervision General bed mobility comments: Cues for technique.  Transfers Overall transfer level: Needs assistance Equipment used: Rolling walker (2 wheeled) Transfers: Sit to/from Stand Sit to Stand: Min guard;Supervision         General transfer comment: Min guard/Supervision for transfers. Cues for hand placement.    Balance                                   ADL   Grooming: Wash/dry hands;Set up;Supervision/safety;Standing (cues for precautions)   Upper Body Dressing : Set up;Supervision/safety;Sitting (back brace)   Lower Body Dressing: With adaptive equipment;Sit to/from stand;Cueing for back precautions;Set up;Supervision/safety Toilet Transfer: Regular Toilet;Supervision/safety Toileting- Clothing Manipulation and Hygiene: Moderate assistance;Sit to/from stand;Cueing for back precautions   Functional mobility during ADLs: Supervision/safety;Min guard General ADL Comments: Practiced with AE for LB ADLs. Educated on  toilet aid for hygiene. Discussed use of bag on walker. Recommended having friend pick up rugs in house and reviewed safe shoewear. Educated on various tub shower techniques.       Vision                     Perception     Praxis      Cognition   Behavior [facility] tasks assessed/performed Overall Cognitive Status: Within Functional Limits for tasks assessed                       Extremity/Trunk Assessment               Exercises       General Comments      Pertinent Vitals/ Pain       Pain 6/10. Nurse notified. Repositioned.   Home Living                                          Prior Functioning/Environment              Frequency Min 2X/week     Progress Toward Goals  OT Goals(current goals can now be found in the care plan section)  Progress towards OT goals: Progressing toward goals  Acute Rehab OT Goals Patient Stated Goal: not stated OT Goal Formulation: With patient Time For Goal Achievement: 07/26/13 Potential to Achieve Goals: Good ADL Goals Pt Will Perform Grooming: with modified independence;standing Pt Will Perform Lower Body Dressing: with modified independence;with adaptive equipment;sit to/from stand Pt  Will Transfer to Toilet: with modified independence;ambulating (3 in 1 over commode) Pt Will Perform Toileting - Clothing Manipulation and hygiene: with modified independence;sit to/from stand Additional ADL Goal #1: Pt will independently verbalize and demonstrate 3/3 back precautions.   Plan Discharge plan needs to be updated    End of Session Equipment Utilized During Treatment: Gait belt;Rolling walker;Back brace  Activity Tolerance Patient tolerated treatment well   Patient Left in bed;with call bell/phone within reach   Nurse Communication Mobility status        Time: 5188-4166 OT Time Calculation (min): 33 min  Charges: OT General Charges $OT Visit: 1 Procedure OT  Treatments $Self Care/Home Management : 23-37 mins  Earlie Raveling OTR/L [phone] 07/20/2013, 2:52 PM

## 2013-07-20 NOTE — Progress Notes (Signed)
Physical Therapy Treatment Patient Details Name: Betty Fuentes M Fuentes MRN: [id number] DOB: Jul 07, 1960 Today's Date: 07/20/2013    History of Present Illness Patient is s/p LUMBAR FOUR TO FIVE POSTERIOR LUMBAR INTERBODY FUSION 1 LEVEL     PT Comments    Patient progressing with mobility and independence.  Did much better today with tranfers.  Patient has several friends that have offered to stay with her during day.  I feel patient will be safe to d/c home with HHPT and friends checking in on her during day.  Patient with no apparent balance deficits and supervision to modified indepedent with mobility.  Will see tomorrow for progressive gait and stair training in preparation for d/c home.    Follow Up Recommendations  Home health PT;Supervision - Intermittent     Equipment Recommendations  Rolling walker with 5" wheels;3in1 (PT)    Recommendations for Other Services       Precautions / Restrictions Precautions Precautions: Back Precaution Comments: Reviewed precautions with pt Required Braces or Orthoses: Spinal Brace Spinal Brace: Lumbar corset;Applied in sitting position Restrictions Weight Bearing Restrictions: No    Mobility  Bed Mobility               General bed mobility comments: patient in chair upon arrival  Transfers Overall transfer level: Needs assistance Equipment used: Rolling walker (2 wheeled) Transfers: Sit to/from Stand Sit to Stand: Supervision         General transfer comment: cues for hand placement.  Ambulation/Gait Ambulation/Gait assistance: Modified independent (Device/Increase time) Ambulation Distance (Feet): 200 Feet Assistive device: Rolling walker (2 wheeled) Gait Pattern/deviations: Step-through pattern     General Gait Details: steady with ambulation   Stairs            Wheelchair Mobility    Modified Rankin (Stroke Patients Only)       Balance Overall balance assessment: No apparent balance deficits (not  formally assessed)                                  Cognition Arousal/Alertness: Awake/alert Behavior During Therapy: WFL for tasks assessed/performed Overall Cognitive Status: Within Functional Limits for tasks assessed                      Exercises      General Comments General comments (skin integrity, edema, etc.): reviewed back precautions with patient      Pertinent Vitals/Pain Patient reports pain of 6/10 in back.  Patient was premedicated for therapy and pain monitored during session.    Home Living                      Prior Function            PT Goals (current goals can now be found in the care plan section) Progress towards PT goals: Progressing toward goals    Frequency  Min 5X/week    PT Plan Discharge plan needs to be updated    End of Session Equipment Utilized During Treatment: Gait belt;Back brace Activity Tolerance: Patient tolerated treatment well Patient left: in chair;with call bell/phone within reach     Time: 1010-1029 PT Time Calculation (min): 19 min  Charges:  $Gait Training: 8-22 mins                    G Codes:      Lorena Benham, Sela HildingMarjorie M, PT  [phone] 07/20/2013, 10:32 AM

## 2013-07-21 LAB — GLUCOSE, CAPILLARY: Glucose-Capillary: 207 mg/dL — ABNORMAL HIGH (ref 70–99)

## 2013-07-21 MED ORDER — OXYCODONE-ACETAMINOPHEN 10-325 MG PO TABS: 1.0000 | ORAL_TABLET | ORAL | Status: DC | PRN

## 2013-07-21 MED ORDER — DIAZEPAM 5 MG PO TABS: 5.0000 mg | ORAL_TABLET | Freq: Four times a day (QID) | ORAL | Status: DC | PRN

## 2013-07-21 NOTE — Discharge Instructions (Signed)

## 2013-07-21 NOTE — Discharge Summary (Signed)
Physician Discharge Summary  Patient ID: Betty Fuentes MRN: [id number] DOB/AGE: [birth year] 55 y.o.  Admit date: 07/19/2013 Discharge date: 07/21/2013  Admission Diagnoses:  Discharge Diagnoses:  Principal Problem:   Degenerative spondylolisthesis   Discharged Condition: good  Hospital Course: Patient admitted to the hospital where she underwent an uncomplicated L4-5 decompression and fusion. Postoperative she is doing well. Preoperative back and leg pain are improved. She is ambulating with minimal difficulty. She is ready for discharge home.  Consults:   Significant Diagnostic Studies:   Treatments:   Discharge Exam: Blood pressure 115/78, pulse 115, temperature 99 F (37.2 C), temperature source Oral, resp. rate 18, SpO2 94.00%. Awake and alert. Oriented and appropriate. Cranial nerve function intact. Motor and sensory function extremities normal. Wound clean and dry. Chest and abdomen benign.  Disposition: 01-Home or Self Care     Medication List         amitriptyline 50 MG tablet  Commonly known as:  ELAVIL  Take 50 mg by mouth at bedtime.     citalopram 40 MG tablet  Commonly known as:  CELEXA  Take 40 mg by mouth daily.     clonazePAM 0.5 MG tablet  Commonly known as:  KLONOPIN  Take 0.5 mg by mouth 3 (three) times daily as needed. For anxiety     cyclobenzaprine 10 MG tablet  Commonly known as:  FLEXERIL  Take 10 mg by mouth 3 (three) times daily as needed for muscle spasms.     diazepam 5 MG tablet  Commonly known as:  VALIUM  Take 1-2 tablets (5-10 mg total) by mouth every 6 (six) hours as needed for anxiety.     glipiZIDE 5 MG tablet  Commonly known as:  GLUCOTROL  Take 5 mg by mouth 2 (two) times daily before a meal.     HYDROcodone-acetaminophen 7.5-325 MG per tablet  Commonly known as:  NORCO  Take 1 tablet by mouth every 4 (four) hours as needed for moderate pain.     ibuprofen 800 MG tablet  Commonly known as:  ADVIL,MOTRIN  Take 800  mg by mouth every 8 (eight) hours as needed for moderate pain.     lisinopril-hydrochlorothiazide 20-12.5 MG per tablet  Commonly known as:  PRINZIDE,ZESTORETIC  Take 1 tablet by mouth daily.     metFORMIN 1000 MG tablet  Commonly known as:  GLUCOPHAGE  Take 1,000 mg by mouth 2 (two) times daily with a meal.     metoprolol 50 MG tablet  Commonly known as:  LOPRESSOR  Take 50 mg by mouth 2 (two) times daily.     oxyCODONE-acetaminophen 10-325 MG per tablet  Commonly known as:  PERCOCET  Take 1-2 tablets by mouth every 4 (four) hours as needed for pain.     potassium chloride SA 20 MEQ tablet  Commonly known as:  K-DUR,KLOR-CON  Take 1 tablet (20 mEq total) by mouth 2 (two) times daily.     pravastatin 40 MG tablet  Commonly known as:  PRAVACHOL  Take 80 mg by mouth at bedtime.     traZODone 100 MG tablet  Commonly known as:  DESYREL  Take 100 mg by mouth at bedtime.           Follow-up Information   Schedule an appointment as soon as possible for a visit with Temple Pacini, MD.   Specialty:  Neurosurgery   Contact information:   [address] [place] [address] [phone]  Signed: Reinette Cuneo A 07/21/2013, 9:29 AM

## 2013-07-21 NOTE — Care Management Note (Signed)
CARE MANAGEMENT NOTE 07/21/2013  Patient:  Betty Fuentes,Betty Fuentes   Account Number:  [id number]  Date Initiated:  07/21/2013  Documentation initiated by:  Vance PeperBRADY,Oluwatobiloba Martin  Subjective/Objective Assessment:   53 yr old female s/p L4-5 decompression and fusion     Action/Plan:   Case manager received order for Home health needs. Unfortunately, patient has medicaid and HH therapy for this procedure is not covered. CM explained this to patient and charge nurse.   Anticipated DC Date:  07/21/2013   Anticipated DC Plan:  HOME/SELF CARE      DC Planning Services  CM consult      Choice offered to / List presented to:

## 2013-07-21 NOTE — Progress Notes (Signed)
Pt given D/C instructions with Rx's, verbal understanding was given. Pt's IV and hemovac was D/C'd prior to D/C. Pt received rolling walker and 3-n-1 from [facility] prior to D/C. Pt D/C'd home via wheelchair @ 1105 per MD order. Pt is stable @ D/C and has no other needs. Rema FendtAshley Allred, RN

## 2013-07-23 ENCOUNTER — Emergency Department (HOSPITAL_COMMUNITY): Payer: Medicaid Other

## 2013-07-23 ENCOUNTER — Inpatient Hospital Stay (HOSPITAL_COMMUNITY)
Admission: EM | Admit: 2013-07-23 | Discharge: 2013-07-25 | DRG: 552 | Disposition: A | Discharge status: Home Health | Payer: Medicaid Other | Attending: Internal Medicine | Admitting: Internal Medicine

## 2013-07-23 ENCOUNTER — Encounter (HOSPITAL_COMMUNITY): Payer: Self-pay | Admitting: Emergency Medicine

## 2013-07-23 DIAGNOSIS — I129 Hypertensive chronic kidney disease with stage 1 through stage 4 chronic kidney disease, or unspecified chronic kidney disease: Secondary | ICD-10-CM | POA: Diagnosis present

## 2013-07-23 DIAGNOSIS — G459 Transient cerebral ischemic attack, unspecified: Secondary | ICD-10-CM

## 2013-07-23 DIAGNOSIS — R799 Abnormal finding of blood chemistry, unspecified: Secondary | ICD-10-CM

## 2013-07-23 DIAGNOSIS — M129 Arthropathy, unspecified: Secondary | ICD-10-CM | POA: Diagnosis present

## 2013-07-23 DIAGNOSIS — E872 Acidosis, unspecified: Secondary | ICD-10-CM

## 2013-07-23 DIAGNOSIS — M549 Dorsalgia, unspecified: Secondary | ICD-10-CM

## 2013-07-23 DIAGNOSIS — Z79899 Other long term (current) drug therapy: Secondary | ICD-10-CM

## 2013-07-23 DIAGNOSIS — M4310 Spondylolisthesis, site unspecified: Secondary | ICD-10-CM

## 2013-07-23 DIAGNOSIS — E871 Hypo-osmolality and hyponatremia: Secondary | ICD-10-CM | POA: Diagnosis present

## 2013-07-23 DIAGNOSIS — N189 Chronic kidney disease, unspecified: Secondary | ICD-10-CM | POA: Diagnosis present

## 2013-07-23 DIAGNOSIS — E139 Other specified diabetes mellitus without complications: Secondary | ICD-10-CM

## 2013-07-23 DIAGNOSIS — E7800 Pure hypercholesterolemia, unspecified: Secondary | ICD-10-CM | POA: Diagnosis present

## 2013-07-23 DIAGNOSIS — F329 Major depressive disorder, single episode, unspecified: Secondary | ICD-10-CM | POA: Diagnosis present

## 2013-07-23 DIAGNOSIS — M5030 Other cervical disc degeneration, unspecified cervical region: Secondary | ICD-10-CM | POA: Diagnosis present

## 2013-07-23 DIAGNOSIS — D72829 Elevated white blood cell count, unspecified: Secondary | ICD-10-CM

## 2013-07-23 DIAGNOSIS — M23329 Other meniscus derangements, posterior horn of medial meniscus, unspecified knee: Secondary | ICD-10-CM

## 2013-07-23 DIAGNOSIS — E875 Hyperkalemia: Secondary | ICD-10-CM

## 2013-07-23 DIAGNOSIS — E119 Type 2 diabetes mellitus without complications: Secondary | ICD-10-CM | POA: Diagnosis present

## 2013-07-23 DIAGNOSIS — E669 Obesity, unspecified: Secondary | ICD-10-CM

## 2013-07-23 DIAGNOSIS — L03317 Cellulitis of buttock: Secondary | ICD-10-CM

## 2013-07-23 DIAGNOSIS — I639 Cerebral infarction, unspecified: Secondary | ICD-10-CM

## 2013-07-23 DIAGNOSIS — A0472 Enterocolitis due to Clostridium difficile, not specified as recurrent: Secondary | ICD-10-CM

## 2013-07-23 DIAGNOSIS — Z981 Arthrodesis status: Secondary | ICD-10-CM

## 2013-07-23 DIAGNOSIS — R7989 Other specified abnormal findings of blood chemistry: Secondary | ICD-10-CM | POA: Diagnosis present

## 2013-07-23 DIAGNOSIS — M47812 Spondylosis without myelopathy or radiculopathy, cervical region: Principal | ICD-10-CM | POA: Diagnosis present

## 2013-07-23 DIAGNOSIS — N179 Acute kidney failure, unspecified: Secondary | ICD-10-CM

## 2013-07-23 DIAGNOSIS — S93409A Sprain of unspecified ligament of unspecified ankle, initial encounter: Secondary | ICD-10-CM

## 2013-07-23 DIAGNOSIS — F411 Generalized anxiety disorder: Secondary | ICD-10-CM

## 2013-07-23 DIAGNOSIS — F3289 Other specified depressive episodes: Secondary | ICD-10-CM | POA: Diagnosis present

## 2013-07-23 DIAGNOSIS — G8929 Other chronic pain: Secondary | ICD-10-CM | POA: Diagnosis present

## 2013-07-23 DIAGNOSIS — E739 Lactose intolerance, unspecified: Secondary | ICD-10-CM

## 2013-07-23 DIAGNOSIS — D649 Anemia, unspecified: Secondary | ICD-10-CM

## 2013-07-23 DIAGNOSIS — E876 Hypokalemia: Secondary | ICD-10-CM

## 2013-07-23 DIAGNOSIS — I10 Essential (primary) hypertension: Secondary | ICD-10-CM

## 2013-07-23 LAB — CBC
HEMATOCRIT: 36.8 % (ref 36.0–46.0)
Hemoglobin: 12.1 g/dL (ref 12.0–15.0)
MCH: 28.2 pg (ref 26.0–34.0)
MCHC: 32.9 g/dL (ref 30.0–36.0)
MCV: 85.8 fL (ref 78.0–100.0)
PLATELETS: 272 10*3/uL (ref 150–400)
RBC: 4.29 MIL/uL (ref 3.87–5.11)
RDW: 13.2 % (ref 11.5–15.5)
WBC: 12.1 10*3/uL — AB (ref 4.0–10.5)

## 2013-07-23 LAB — ETHANOL

## 2013-07-23 LAB — DIFFERENTIAL
BASOS ABS: 0 10*3/uL (ref 0.0–0.1)
BASOS PCT: 0 % (ref 0–1)
EOS PCT: 0 % (ref 0–5)
Eosinophils Absolute: 0 10*3/uL (ref 0.0–0.7)
Lymphocytes Relative: 11 % — ABNORMAL LOW (ref 12–46)
Lymphs Abs: 1.3 10*3/uL (ref 0.7–4.0)
MONO ABS: 0.7 10*3/uL (ref 0.1–1.0)
Monocytes Relative: 6 % (ref 3–12)
Neutro Abs: 10.1 10*3/uL — ABNORMAL HIGH (ref 1.7–7.7)
Neutrophils Relative %: 84 % — ABNORMAL HIGH (ref 43–77)

## 2013-07-23 LAB — URINALYSIS, ROUTINE W REFLEX MICROSCOPIC
Glucose, UA: 100 mg/dL — AB
KETONES UR: NEGATIVE mg/dL
NITRITE: NEGATIVE
PH: 5.5 (ref 5.0–8.0)
PROTEIN: 100 mg/dL — AB
Specific Gravity, Urine: >1.03 — ABNORMAL HIGH (ref 1.005–1.030)
UROBILINOGEN UA: 0.2 mg/dL (ref 0.0–1.0)

## 2013-07-23 LAB — COMPREHENSIVE METABOLIC PANEL
ALBUMIN: 3.3 g/dL — AB (ref 3.5–5.2)
ALT: 55 U/L — ABNORMAL HIGH (ref 0–35)
AST: 110 U/L — AB (ref 0–37)
Alkaline Phosphatase: 154 U/L — ABNORMAL HIGH (ref 39–117)
BUN: 34 mg/dL — ABNORMAL HIGH (ref 6–23)
CALCIUM: 9.4 mg/dL (ref 8.4–10.5)
CO2: 19 mEq/L (ref 19–32)
CREATININE: 2.36 mg/dL — AB (ref 0.50–1.10)
Chloride: 95 mEq/L — ABNORMAL LOW (ref 96–112)
GFR calc Af Amer: 26 mL/min — ABNORMAL LOW (ref >90)
GFR calc non Af Amer: 22 mL/min — ABNORMAL LOW (ref >90)
Glucose, Bld: 324 mg/dL — ABNORMAL HIGH (ref 70–99)
Potassium: 6.3 mEq/L — ABNORMAL HIGH (ref 3.7–5.3)
SODIUM: 129 meq/L — AB (ref 137–147)
TOTAL PROTEIN: 8.6 g/dL — AB (ref 6.0–8.3)
Total Bilirubin: 0.3 mg/dL (ref 0.3–1.2)

## 2013-07-23 LAB — RAPID URINE DRUG SCREEN, HOSP PERFORMED
AMPHETAMINES: NOT DETECTED
BARBITURATES: NOT DETECTED
Benzodiazepines: POSITIVE — AB
COCAINE: NOT DETECTED
Opiates: NOT DETECTED
TETRAHYDROCANNABINOL: NOT DETECTED

## 2013-07-23 LAB — URINE MICROSCOPIC-ADD ON

## 2013-07-23 LAB — PROTIME-INR
INR: 1.03 (ref 0.00–1.49)
PROTHROMBIN TIME: 13.3 s (ref 11.6–15.2)

## 2013-07-23 LAB — GLUCOSE, CAPILLARY: Glucose-Capillary: 151 mg/dL — ABNORMAL HIGH (ref 70–99)

## 2013-07-23 LAB — TROPONIN I

## 2013-07-23 LAB — APTT: aPTT: 35 seconds (ref 24–37)

## 2013-07-23 MED ORDER — SIMVASTATIN 20 MG PO TABS
40.0000 mg | ORAL_TABLET | Freq: Every day | ORAL | Status: DC
  Administered 2013-07-23 – 2013-07-24 (×2): 40 mg via ORAL
  Filled 2013-07-23 (×2): qty 2

## 2013-07-23 MED ORDER — AMITRIPTYLINE HCL 25 MG PO TABS
50.0000 mg | ORAL_TABLET | Freq: Every day | ORAL | Status: DC
  Administered 2013-07-23 – 2013-07-24 (×2): 50 mg via ORAL
  Filled 2013-07-23 (×2): qty 2

## 2013-07-23 MED ORDER — SODIUM POLYSTYRENE SULFONATE 15 GM/60ML PO SUSP
30.0000 g | Freq: Once | ORAL | Status: AC
  Administered 2013-07-23: 30 g via ORAL
  Filled 2013-07-23: qty 120

## 2013-07-23 MED ORDER — TRAZODONE HCL 50 MG PO TABS
100.0000 mg | ORAL_TABLET | Freq: Every day | ORAL | Status: DC
  Administered 2013-07-23 – 2013-07-24 (×2): 100 mg via ORAL
  Filled 2013-07-23 (×2): qty 2

## 2013-07-23 MED ORDER — ACETAMINOPHEN 325 MG PO TABS
650.0000 mg | ORAL_TABLET | ORAL | Status: DC | PRN
Start: 2013-07-23 — End: 2013-07-25

## 2013-07-23 MED ORDER — CITALOPRAM HYDROBROMIDE 20 MG PO TABS
40.0000 mg | ORAL_TABLET | Freq: Every morning | ORAL | Status: DC
  Administered 2013-07-24 – 2013-07-25 (×2): 40 mg via ORAL
  Filled 2013-07-23 (×2): qty 2

## 2013-07-23 MED ORDER — HYDROCHLOROTHIAZIDE 12.5 MG PO CAPS: 12.5000 mg | ORAL_CAPSULE | Freq: Every day | ORAL | Status: DC

## 2013-07-23 MED ORDER — METOPROLOL TARTRATE 50 MG PO TABS
50.0000 mg | ORAL_TABLET | Freq: Two times a day (BID) | ORAL | Status: DC
  Administered 2013-07-23 – 2013-07-25 (×4): 50 mg via ORAL
  Filled 2013-07-23 (×4): qty 1

## 2013-07-23 MED ORDER — HYDROCODONE-ACETAMINOPHEN 7.5-325 MG PO TABS
1.0000 | ORAL_TABLET | ORAL | Status: DC | PRN
  Administered 2013-07-24: 1 via ORAL
  Filled 2013-07-23: qty 1

## 2013-07-23 MED ORDER — LISINOPRIL-HYDROCHLOROTHIAZIDE 20-12.5 MG PO TABS: 1.0000 | ORAL_TABLET | Freq: Every day | ORAL | Status: DC

## 2013-07-23 MED ORDER — CLONAZEPAM 0.5 MG PO TABS
0.5000 mg | ORAL_TABLET | Freq: Three times a day (TID) | ORAL | Status: DC | PRN
  Administered 2013-07-23: 0.5 mg via ORAL
  Filled 2013-07-23: qty 1

## 2013-07-23 MED ORDER — ASPIRIN 325 MG PO TABS
325.0000 mg | ORAL_TABLET | Freq: Every day | ORAL | Status: DC
  Administered 2013-07-23 – 2013-07-25 (×3): 325 mg via ORAL
  Filled 2013-07-23 (×3): qty 1

## 2013-07-23 MED ORDER — METFORMIN HCL 500 MG PO TABS: 1000.0000 mg | ORAL_TABLET | Freq: Two times a day (BID) | ORAL | Status: DC

## 2013-07-23 MED ORDER — OXYCODONE-ACETAMINOPHEN 5-325 MG PO TABS: 1.0000 | ORAL_TABLET | ORAL | Status: DC | PRN

## 2013-07-23 MED ORDER — HEPARIN SODIUM (PORCINE) 5000 UNIT/ML IJ SOLN
5000.0000 [IU] | Freq: Three times a day (TID) | INTRAMUSCULAR | Status: DC
  Administered 2013-07-23 – 2013-07-25 (×5): 5000 [IU] via SUBCUTANEOUS
  Filled 2013-07-23 (×5): qty 1

## 2013-07-23 MED ORDER — OXYCODONE-ACETAMINOPHEN 10-325 MG PO TABS: 1.0000 | ORAL_TABLET | ORAL | Status: DC | PRN

## 2013-07-23 MED ORDER — DIAZEPAM 5 MG PO TABS: 5.0000 mg | ORAL_TABLET | Freq: Four times a day (QID) | ORAL | Status: DC | PRN

## 2013-07-23 MED ORDER — OXYCODONE HCL 5 MG PO TABS
5.0000 mg | ORAL_TABLET | ORAL | Status: DC | PRN
Start: 2013-07-23 — End: 2013-07-24
  Administered 2013-07-23: 5 mg via ORAL
  Filled 2013-07-23: qty 1

## 2013-07-23 MED ORDER — LISINOPRIL 10 MG PO TABS: 20.0000 mg | ORAL_TABLET | Freq: Every day | ORAL | Status: DC

## 2013-07-23 MED ORDER — CYCLOBENZAPRINE HCL 10 MG PO TABS: 10.0000 mg | ORAL_TABLET | Freq: Three times a day (TID) | ORAL | Status: DC | PRN

## 2013-07-23 MED ORDER — IBUPROFEN 800 MG PO TABS: 800.0000 mg | ORAL_TABLET | Freq: Three times a day (TID) | ORAL | Status: DC | PRN

## 2013-07-23 MED ORDER — GLIPIZIDE 5 MG PO TABS
5.0000 mg | ORAL_TABLET | Freq: Two times a day (BID) | ORAL | Status: DC
  Administered 2013-07-24 – 2013-07-25 (×3): 5 mg via ORAL
  Filled 2013-07-23 (×3): qty 1

## 2013-07-23 MED ORDER — SODIUM CHLORIDE 0.9 % IV SOLN
1.0000 g | Freq: Once | INTRAVENOUS | Status: AC
  Administered 2013-07-23: 1 g via INTRAVENOUS
  Filled 2013-07-23: qty 10

## 2013-07-23 NOTE — ED Notes (Signed)
Patient placed on 2 liters of oxygen via nasal canula. Oxygen saturation 88-90% on room air.

## 2013-07-23 NOTE — ED Notes (Signed)
Deactivated code stroke

## 2013-07-23 NOTE — ED Notes (Signed)
Pt had lumbar surgery on Monday and states she has numbness and weakness to right side of body. Pt was unable to stand so she slid to the floor. Pt has been in the floor since this morning, unable to get up due to weakness.
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
## 2013-07-24 ENCOUNTER — Inpatient Hospital Stay (HOSPITAL_COMMUNITY): Payer: Medicaid Other

## 2013-07-24 DIAGNOSIS — E119 Type 2 diabetes mellitus without complications: Secondary | ICD-10-CM

## 2013-07-24 DIAGNOSIS — G459 Transient cerebral ischemic attack, unspecified: Secondary | ICD-10-CM

## 2013-07-24 DIAGNOSIS — E875 Hyperkalemia: Secondary | ICD-10-CM

## 2013-07-24 DIAGNOSIS — F411 Generalized anxiety disorder: Secondary | ICD-10-CM

## 2013-07-24 DIAGNOSIS — I517 Cardiomegaly: Secondary | ICD-10-CM

## 2013-07-24 LAB — BASIC METABOLIC PANEL
BUN: 35 mg/dL — ABNORMAL HIGH (ref 6–23)
CALCIUM: 9.5 mg/dL (ref 8.4–10.5)
CO2: 19 mEq/L (ref 19–32)
Chloride: 97 mEq/L (ref 96–112)
Creatinine, Ser: 1.83 mg/dL — ABNORMAL HIGH (ref 0.50–1.10)
GFR calc Af Amer: 35 mL/min — ABNORMAL LOW (ref >90)
GFR calc non Af Amer: 30 mL/min — ABNORMAL LOW (ref >90)
GLUCOSE: 267 mg/dL — AB (ref 70–99)
Potassium: 4.9 mEq/L (ref 3.7–5.3)
SODIUM: 133 meq/L — AB (ref 137–147)

## 2013-07-24 LAB — GLUCOSE, CAPILLARY
GLUCOSE-CAPILLARY: 237 mg/dL — AB (ref 70–99)
GLUCOSE-CAPILLARY: 303 mg/dL — AB (ref 70–99)
GLUCOSE-CAPILLARY: 124 mg/dL — AB (ref 70–99)
Glucose-Capillary: 203 mg/dL — ABNORMAL HIGH (ref 70–99)

## 2013-07-24 LAB — LIPID PANEL
CHOLESTEROL: 167 mg/dL (ref 0–200)
HDL: 39 mg/dL — AB (ref >39)
LDL Cholesterol: 76 mg/dL (ref 0–99)
TRIGLYCERIDES: 262 mg/dL — AB (ref <150)
Total CHOL/HDL Ratio: 4.3 RATIO
VLDL: 52 mg/dL — ABNORMAL HIGH (ref 0–40)

## 2013-07-24 LAB — HEMOGLOBIN A1C
HEMOGLOBIN A1C: 10.4 % — AB (ref <5.7)
MEAN PLASMA GLUCOSE: 252 mg/dL — AB (ref <117)

## 2013-07-24 MED ORDER — POLYETHYLENE GLYCOL 3350 17 G PO PACK
17.0000 g | PACK | Freq: Every day | ORAL | Status: DC
  Administered 2013-07-24 – 2013-07-25 (×2): 17 g via ORAL
  Filled 2013-07-24 (×2): qty 1

## 2013-07-24 MED ORDER — SODIUM CHLORIDE 0.9 % IV SOLN
INTRAVENOUS | Status: DC
  Administered 2013-07-24 (×2): via INTRAVENOUS

## 2013-07-24 MED ORDER — INSULIN ASPART 100 UNIT/ML ~~LOC~~ SOLN
0.0000 [IU] | Freq: Three times a day (TID) | SUBCUTANEOUS | Status: DC
  Administered 2013-07-24: 2 [IU] via SUBCUTANEOUS
  Administered 2013-07-24: 11 [IU] via SUBCUTANEOUS
  Administered 2013-07-25: 5 [IU] via SUBCUTANEOUS
  Administered 2013-07-25: 3 [IU] via SUBCUTANEOUS

## 2013-07-24 NOTE — Progress Notes (Signed)
  Echocardiogram 2D Echocardiogram has been performed.  Nestor RampRoberts, Ogechi Kuehnel M 07/24/2013, 11:16 AM

## 2013-07-24 NOTE — Progress Notes (Signed)
07/23/13 1142 Patient requested dressing to back incision from recent back surgery be changed today if possible. States "it hasn't been changed since surgery". Also requested something for constipation, stated "i haven't had a bowel movement since surgery". Bowel sounds present, passing flatus. Notified Dr. Jomarie LongsJoseph. Also notified MD of elevated CBG: 303. Orders placed for Miralax and insulin coverage. Nursing may change back incision dressing as needed. Notified MD of carotid doppler, CT, and echo completed this morning as ordered. Earnstine RegalAshley Joshawn Crissman, RN

## 2013-07-24 NOTE — Progress Notes (Signed)
[facility] PROGRESS NOTE  ANALYSA NUTTING ZOX:[id number] DOB: [birth year] DOA: 07/23/2013 PCP: Pcp Not In System  Assessment/Plan: 1. RUE weakness -MRI negative -suspect radiculopathy, has h/o cervical spinal disease -will check CT C spine -Pt eval -will need to get this evaluated by Dr.Pool(NSG) in FU  2. DM -add SSI, glipizide -stop metformin due to AKI  3. AKi on CKD -due to dehydration, NSAIDs -stop ACE/NSAIDs -gentle IVF  4. Hyperkalemia -due to AKI/ACE -improved with kayexalate and stopping offending meds  5. Chronic pain/anxiety -continue home meds, need to be weaned as tolerated -defer to PCP who manages this  6. Recent L4-5 decompression and fusion for degenerative spondylolisthesis -Fu with Dr.Pool -PT/OT eval  DVT proph: hep SQ  Code Status: Full Code Family Communication: none at bedside Disposition Plan: home pending workup  HPI/Subjective: Still has RUE weakness  Objective: Filed Vitals:   07/24/13 0615  BP: 112/73  Pulse: 110  Temp:   Resp:     Intake/Output Summary (Last 24 hours) at 07/24/13 1200 Last data filed at 07/24/13 0800  Gross per 24 hour  Intake    240 ml  Output      0 ml  Net    240 ml   Filed Weights   07/23/13 2027  Weight: 87 kg (191 lb 12.8 oz)    Exam:   General:  AAOx3  Cardiovascular: S1S2/RRR  Respiratory: CTAB  Abdomen: soft, Nt, BS present  Musculoskeletal: no edema c/c  Neuro: RUE weakness and pain   Data Reviewed: Basic Metabolic Panel:  Recent Labs Lab 07/23/13 1613 07/24/13 0818  NA 129* 133*  K 6.3* 4.9  CL 95* 97  CO2 19 19  GLUCOSE 324* 267*  BUN 34* 35*  CREATININE 2.36* 1.83*  CALCIUM 9.4 9.5   Liver Function Tests:  Recent Labs Lab 07/23/13 1613  AST 110*  ALT 55*  ALKPHOS 154*  BILITOT 0.3  PROT 8.6*  ALBUMIN 3.3*   No results found for this basename: LIPASE, AMYLASE,  in the last 168 hours No results found for this basename: AMMONIA,  in the last 168  hours CBC:  Recent Labs Lab 07/23/13 1613  WBC 12.1*  NEUTROABS 10.1*  HGB 12.1  HCT 36.8  MCV 85.8  PLT 272   Cardiac Enzymes:  Recent Labs Lab 07/23/13 1613  TROPONINI <0.30   BNP (last 3 results) No results found for this basename: PROBNP,  in the last 8760 hours CBG:  Recent Labs Lab 07/20/13 2145 07/21/13 0821 07/23/13 2057 07/24/13 0743 07/24/13 1130  GLUCAP 193* 207* 151* 237* 303*    No results found for this or any previous visit (from the past 240 hour(s)).   Studies: Ct Head Wo Contrast  07/23/2013   CLINICAL DATA:  Weakness, confusion  EXAM: CT HEAD WITHOUT CONTRAST  TECHNIQUE: Contiguous axial images were obtained from the base of the skull through the vertex without intravenous contrast.  COMPARISON:  02/20/2009.  FINDINGS: No skull fracture is noted. Paranasal sinuses and mastoid air cells are unremarkable.  No intracranial hemorrhage, mass effect or midline shift.  No acute infarction. No mass lesion is noted on this unenhanced scan. The gray and white-matter differentiation is preserved.  IMPRESSION: No acute intracranial abnormality.   Electronically Signed   By: Natasha Mead M.D.   On: 07/23/2013 16:26   Ct Cervical Spine Wo Contrast  07/24/2013   CLINICAL DATA:  Right upper extremity weakness and numbness.  EXAM: CT CERVICAL SPINE WITHOUT CONTRAST  TECHNIQUE: Multidetector CT imaging of the cervical spine was performed without intravenous contrast. Multiplanar CT image reconstructions were also generated.  COMPARISON:  US CAROTID DUPLEX BILAT dated 07/24/2013; MR MRA HEAD W/O CM dated 07/23/2013; MR HEAD W/O CM dated 07/23/2013; CT HEAD W/O CM dated 07/23/2013  FINDINGS: The head is rotated to the right relative to the cervical spine. There is a mild dextroconvex cervicothoracic curvature. Reversal of the normal cervical lordosis is present with the apex at C5. 2 mm retrolisthesis of C5 on C6 and C6 on C7. 2 mm anterolisthesis of C2 on C3. There is no fracture.  The paraspinal soft tissues appear within normal limits. Study is degraded by body habitus.  C2-C3:  No bony stenosis.  Mild left facet hypertrophy.  C3-C4: Severe disc degeneration with shallow disc osteophyte complex. Small left uncovertebral spur with minimal left foraminal encroachment.  C4-C5: Broad-based disc osteophyte complex producing mild central stenosis. Left-greater-than-right uncovertebral spurring with left foraminal stenosis that potentially affects the left C5 nerve.  C5-C6: Broad-based disc osteophyte complex producing mild to moderate central stenosis. Disc osteophyte complex is left eccentric. Left-greater-than- right foraminal stenosis due to uncovertebral spurring. This potentially affects left-greater-than-right C6 nerves.  C6-C7: Shallow disc osteophyte complex producing mild central stenosis. Bilateral uncovertebral spurring and facet arthrosis producing left-greater-than-right foraminal stenosis that potentially affects both C7 nerves.  C7-T1: Central canal appears patent. Evaluation of this level is technically degraded by body habitus. No bony stenosis.  IMPRESSION: Moderate to severe multilevel cervical spondylosis detailed above. Multilevel disc osteophyte complexes, uncovertebral spurring and facet degeneration are present. In this patient with right upper extremity radiculopathy, right-sided foraminal stenosis is most pronounced at C6-C7 potentially affecting the right C7 nerve.   Electronically Signed   By: Andreas NewportGeoffrey  Lamke M.D.   On: 07/24/2013 10:55   Mr Angiogram Head Wo Contrast  07/23/2013   CLINICAL DATA:  53 year old female with right side weakness. Initial encounter.  EXAM: MRI HEAD WITHOUT CONTRAST  MRA HEAD WITHOUT CONTRAST  TECHNIQUE: Multiplanar, multiecho pulse sequences of the brain and surrounding structures were obtained without intravenous contrast. Angiographic images of the head were obtained using MRA technique without contrast.  COMPARISON:  Head CT without  contrast 07/23/2013 at 1624 hrs.  FINDINGS: MRI HEAD FINDINGS  Study is intermittently degraded by motion artifact despite repeated imaging attempts.  Cerebral volume is within normal limits for age. No restricted diffusion to suggest acute infarction. No midline shift, mass effect, evidence of mass lesion, ventriculomegaly, extra-axial collection or acute intracranial hemorrhage. Cervicomedullary junction and pituitary are within normal limits. Major intracranial vascular flow voids are preserved. Negative visualized cervical spine. Normal bone marrow signal. Wallace CullensGray and white matter signal is within normal limits throughout the brain.  Grossly normal visualized internal auditory structures. Mastoids are clear. Paranasal sinuses are clear. Visualized orbit soft tissues are within normal limits. Visualized scalp soft tissues are within normal limits.  MRA HEAD FINDINGS  Study is intermittently degraded by motion artifact despite repeated imaging attempts.  Antegrade flow in the posterior circulation with codominant distal vertebral arteries. Normal PICA origins. Tortuous but otherwise normal vertebrobasilar junction. No basilar stenosis. Patent AICA and SCA origins. Normal PCA origins and posterior communicating arteries. Bilateral PCA branches are within normal limits.  Antegrade flow in both ICA siphons. No definite ICA stenosis. Ophthalmic and posterior communicating artery origins are within normal limits.  Normal carotid termini, MCA and ACA origins. Diminutive or absent anterior communicating artery. Detail of the bilateral MCA and ACA branches is  mildly degraded by motion. No MCA or ACA branch abnormality identified.  IMPRESSION: 1.  Normal non contrast MRI appearance of the brain. 2. Negative intracranial MRA, allowing for intermittent motion artifact.   Electronically Signed   By: Augusto Gamble M.D.   On: 07/23/2013 19:25   Mr Brain Wo Contrast  07/23/2013   CLINICAL DATA:  53 year old female with right side  weakness. Initial encounter.  EXAM: MRI HEAD WITHOUT CONTRAST  MRA HEAD WITHOUT CONTRAST  TECHNIQUE: Multiplanar, multiecho pulse sequences of the brain and surrounding structures were obtained without intravenous contrast. Angiographic images of the head were obtained using MRA technique without contrast.  COMPARISON:  Head CT without contrast 07/23/2013 at 1624 hrs.  FINDINGS: MRI HEAD FINDINGS  Study is intermittently degraded by motion artifact despite repeated imaging attempts.  Cerebral volume is within normal limits for age. No restricted diffusion to suggest acute infarction. No midline shift, mass effect, evidence of mass lesion, ventriculomegaly, extra-axial collection or acute intracranial hemorrhage. Cervicomedullary junction and pituitary are within normal limits. Major intracranial vascular flow voids are preserved. Negative visualized cervical spine. Normal bone marrow signal. Wallace Cullens and white matter signal is within normal limits throughout the brain.  Grossly normal visualized internal auditory structures. Mastoids are clear. Paranasal sinuses are clear. Visualized orbit soft tissues are within normal limits. Visualized scalp soft tissues are within normal limits.  MRA HEAD FINDINGS  Study is intermittently degraded by motion artifact despite repeated imaging attempts.  Antegrade flow in the posterior circulation with codominant distal vertebral arteries. Normal PICA origins. Tortuous but otherwise normal vertebrobasilar junction. No basilar stenosis. Patent AICA and SCA origins. Normal PCA origins and posterior communicating arteries. Bilateral PCA branches are within normal limits.  Antegrade flow in both ICA siphons. No definite ICA stenosis. Ophthalmic and posterior communicating artery origins are within normal limits.  Normal carotid termini, MCA and ACA origins. Diminutive or absent anterior communicating artery. Detail of the bilateral MCA and ACA branches is mildly degraded by motion. No MCA  or ACA branch abnormality identified.  IMPRESSION: 1.  Normal non contrast MRI appearance of the brain. 2. Negative intracranial MRA, allowing for intermittent motion artifact.   Electronically Signed   By: Augusto Gamble M.D.   On: 07/23/2013 19:25   US Carotid Duplex Bilateral  07/24/2013   CLINICAL DATA:  TIA.  EXAM: BILATERAL CAROTID DUPLEX ULTRASOUND  TECHNIQUE: Wallace Cullens scale imaging, color Doppler and duplex ultrasound were performed of bilateral carotid and vertebral arteries in the neck.  COMPARISON:  None.  FINDINGS: Criteria: Quantification of carotid stenosis is based on velocity parameters that correlate the residual internal carotid diameter with NASCET-based stenosis levels, using the diameter of the distal internal carotid lumen as the denominator for stenosis measurement.  The following velocity measurements were obtained:  RIGHT  ICA:  392 cm/sec  CCA:  117 cm/sec  SYSTOLIC ICA/CCA RATIO:  3.4  DIASTOLIC ICA/CCA RATIO:  5.9  ECA:  183 cm/sec  LEFT  ICA:  211 cm/sec  CCA:  160 cm/sec  SYSTOLIC ICA/CCA RATIO:  1.3  DIASTOLIC ICA/CCA RATIO:  1.8  ECA:  151 cm/sec  RIGHT CAROTID ARTERY: The right carotid arteries are patent. There is mild plaque or intimal thickening in the proximal internal carotid artery but there does not appear to be focal stenosis. However, the peak systolic velocity in the proximal internal carotid artery is markedly elevated, measuring 392 cm/sec. This velocity measurement is discrepant with the grayscale imaging findings. Velocity measurements in the mid and  distal internal carotid artery are close to normal. The mid right internal carotid artery peak systolic velocity measures 137 cm/sec.  RIGHT VERTEBRAL ARTERY: Antegrade flow and normal waveform in the right vertebral artery.  LEFT CAROTID ARTERY: Mild intimal thickening in the left carotid arteries. There is plaque in the proximal left internal carotid artery. Peak systolic velocity in the proximal internal carotid artery  measures 211 cm/sec.  LEFT VERTEBRAL ARTERY: Antegrade flow and normal waveform in the left vertebral artery.  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 The velocity measurements in the proximal right internal carotid artery are markedly elevated and suggest a critical stenosis but this does not correlate with the grayscale imaging. The velocity measurements in the left internal carotid artery suggests a stenosis between 50-69%. Due to the discrepant findings between the velocity measurements and grayscale imaging, recommend further characterization of the carotid arteries with MRA or CTA.  Patent vertebral arteries.   Electronically Signed   By: Richarda Overlie M.D.   On: 07/24/2013 10:40    Scheduled Meds: . amitriptyline  50 mg Oral QHS  . aspirin  325 mg Oral Daily  . citalopram  40 mg Oral q morning - 10a  . glipiZIDE  5 mg Oral BID AC  . heparin  5,000 Units Subcutaneous 3 times per day  . insulin aspart  0-15 Units Subcutaneous TID WC  . metoprolol  50 mg Oral BID  . polyethylene glycol  17 g Oral Daily  . simvastatin  40 mg Oral q1800  . traZODone  100 mg Oral QHS   Continuous Infusions:  Antibiotics Given (last 72 hours)   None      Principal Problem:   Transient ischemic attack Active Problems:   ANXIETY   Diabetes 1.5, managed as type 2   Hyperkalemia   Elevated serum creatinine    Time spent:    [facility]  [facility] Pager [phone]. If 7PM-7AM, please contact night-coverage at [email or web address], password [facility] 07/24/2013, 12:00 PM  LOS: 1 day

## 2013-07-25 DIAGNOSIS — N179 Acute kidney failure, unspecified: Secondary | ICD-10-CM

## 2013-07-25 DIAGNOSIS — M4310 Spondylolisthesis, site unspecified: Secondary | ICD-10-CM

## 2013-07-25 DIAGNOSIS — M549 Dorsalgia, unspecified: Secondary | ICD-10-CM

## 2013-07-25 DIAGNOSIS — I10 Essential (primary) hypertension: Secondary | ICD-10-CM

## 2013-07-25 DIAGNOSIS — E119 Type 2 diabetes mellitus without complications: Secondary | ICD-10-CM

## 2013-07-25 LAB — BASIC METABOLIC PANEL
BUN: 25 mg/dL — AB (ref 6–23)
CO2: 23 mEq/L (ref 19–32)
Calcium: 9 mg/dL (ref 8.4–10.5)
Chloride: 103 mEq/L (ref 96–112)
Creatinine, Ser: 0.79 mg/dL (ref 0.50–1.10)
GFR calc Af Amer: >90 mL/min (ref >90)
GLUCOSE: 215 mg/dL — AB (ref 70–99)
POTASSIUM: 4 meq/L (ref 3.7–5.3)
Sodium: 138 mEq/L (ref 137–147)

## 2013-07-25 LAB — GLUCOSE, CAPILLARY
Glucose-Capillary: 230 mg/dL — ABNORMAL HIGH (ref 70–99)
Glucose-Capillary: 184 mg/dL — ABNORMAL HIGH (ref 70–99)

## 2013-07-25 MED ORDER — HYDROCHLOROTHIAZIDE 12.5 MG PO CAPS: 12.5000 mg | ORAL_CAPSULE | Freq: Every day | ORAL | Status: DC

## 2013-07-25 MED ORDER — ACETAMINOPHEN 325 MG PO TABS: 650.0000 mg | ORAL_TABLET | ORAL | Status: AC | PRN

## 2013-07-25 MED ORDER — POLYETHYLENE GLYCOL 3350 17 G PO PACK: 17.0000 g | PACK | Freq: Every day | ORAL | Status: AC | PRN

## 2013-07-25 MED ORDER — ASPIRIN 325 MG PO TABS
325.0000 mg | ORAL_TABLET | Freq: Every day | ORAL | Status: AC
Start: 2013-07-25 — End: ?

## 2013-07-25 NOTE — Discharge Summary (Signed)
Physician Discharge Summary  Betty FrederickBrenda M Fuentes RUE:[id number]:[id number] DOB: [birth year] DOA: 07/23/2013  PCP: Pcp Not In System  Admit date: 07/23/2013 Discharge date: 07/25/2013  Time spent: 45 minutes  Recommendations for Outpatient Follow-up:  1. Dr.Pool in 1-2weeks 2. 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 female presents with weakness of the right side of the body. She states that she is not sure when the weakness started but may have been several hours ago. Her friend actually came and found her at home. At that time he states that she was responsive and was not able to move the right side of her body. She now has full movement at times. She has no headache noted. No actual syncope. No chest pain is noted. She had no seizure activity. There was no facial droop noted. She now is awake and is at times very weepy. She states that she does not have much help at home and wants to get some home health assistance  Hospital Course:  1. RUE weakness -MRI negative for CVA -suspect radiculopathy, has h/o cervical spinal disease  -CT C spine with Moderate to severe multilevel cervical spondylosis  right-sided foraminal stenosis is most pronounced at C6-C7 potentially affecting the right C7 nerve. - i called and discussed with Dr.Nundkumar, neurosurgeon on call for Dr.Pool, he recommended against steroids due to recent surgery an asked her to FU with Dr.Pool in office -Home health PT ordered  2.  DM  -continue glipizide  -metformin held temporarily then resumed  3. AKi on CKD  -due to dehydration, NSAIDs  -stop ACE/NSAIDs  -improved back to normal  4. Hyperkalemia  -due to AKI/ACE and NSAIDs -improved with kayexalate and stopping offending meds   5. Chronic pain/anxiety  -continue home meds, need to be weaned as tolerated  -defer to PCP who manages this   6. Recent L4-5 decompression and fusion for degenerative spondylolisthesis  -Fu with Dr.Pool  -[facility] and hence home health PT ordered  7. Carotid artery disease -noted on duplex, which was ordered on admission for r/o CVA -needs referral to Vascular surgery to evaluate extent of disease and further management, asymptomatic -started on ASA  Consultations:  D/w Neurosurgery  2D ECHO Study Conclusions - Left ventricle: The cavity size was normal. Systolic function was vigorous. The estimated ejection fraction was in the range of 65% to 70%. Wall motion was normal; there were no regional wall motion abnormalities. Doppler parameters are consistent with abnormal left ventricular relaxation (grade 1 diastolic dysfunction). - Left atrium: The atrium was mildly dilated.  CArotid Duplex Atherosclerotic disease in the carotid arteries and this appears to  be most prominent in the left proximal internal carotid artery. The  velocity measurements in the proximal right internal carotid artery  are markedly elevated and suggest a critical stenosis but this does  not correlate with the grayscale imaging. The velocity measurements  in the left internal carotid artery  suggests a stenosis between  50-69%. Due to the discrepant findings between the velocity  measurements and grayscale imaging, recommend further  characterization of the carotid arteries with MRA or CTA.   Discharge Exam: Filed Vitals:   07/25/13 0623  BP: 139/87  Pulse: 117  Temp: 98.5 F (36.9 C)  Resp: 20     General: AAOx3 Cardiovascular: S1S2/RRR Respiratory: CTAB  Discharge Instructions  Discharge Orders   Future Orders Complete By Expires   Diet - low sodium heart healthy  As directed    Diet Carb Modified  As directed    Increase activity slowly  As directed        Medication List    STOP taking these medications       ibuprofen 800 MG tablet  Commonly known as:  ADVIL,MOTRIN     lisinopril-hydrochlorothiazide 20-12.5 MG per tablet  Commonly known as:  PRINZIDE,ZESTORETIC     oxyCODONE-acetaminophen 10-325 MG per tablet  Commonly known as:  PERCOCET      TAKE these medications       acetaminophen 325 MG tablet  Commonly known as:  TYLENOL  Take 2 tablets (650 mg total) by mouth every 4 (four) hours as needed for mild pain or headache (temperature >/= 99.5 F).     amitriptyline 50 MG tablet  Commonly known as:  ELAVIL  Take 50 mg by mouth at bedtime.     aspirin 325 MG tablet  Take 1 tablet (325 mg total) by mouth daily.     citalopram 40 MG tablet  Commonly known as:  CELEXA  Take 40 mg by mouth every morning.     clonazePAM 0.5 MG tablet  Commonly known as:  KLONOPIN  Take 0.5 mg by mouth 3 (three) times daily as needed. For anxiety     cyclobenzaprine 10 MG tablet  Commonly known as:  FLEXERIL  Take 10 mg by mouth 3 (three) times daily as needed for muscle spasms.     diazepam 5 MG tablet  Commonly known as:  VALIUM  Take 1-2 tablets (5-10 mg total) by mouth every 6 (six) hours as needed for anxiety.     glipiZIDE 5 MG tablet  Commonly known as:  GLUCOTROL  Take 5 mg by mouth 2 (two) times daily before a meal.     hydrochlorothiazide 12.5 MG capsule  Commonly known as:  MICROZIDE  Take 1 capsule (12.5 mg total) by mouth daily.     HYDROcodone-acetaminophen 7.5-325 MG per tablet  Commonly known as:  NORCO  Take 1 tablet by mouth every 4 (four) hours as needed for moderate pain.     metFORMIN 1000 MG tablet  Commonly known as:  GLUCOPHAGE   Take 1,000 mg by mouth 2 (two) times daily with a meal.     metoprolol 50 MG tablet  Commonly known as:  LOPRESSOR  Take 50 mg by mouth 2 (two) times daily.     polyethylene glycol packet  Commonly known as:  MIRALAX / GLYCOLAX  Take 17 g by mouth daily as needed for moderate constipation.     potassium chloride SA 20 MEQ tablet  Commonly known as:  K-DUR,KLOR-CON  Take 1 tablet (20 mEq total) by mouth 2 (two) times daily.     pravastatin 40 MG tablet  Commonly known as:  PRAVACHOL  Take 80 mg by mouth at bedtime.     traZODone 100 MG tablet  Commonly known as:  DESYREL  Take 100 mg by mouth at  bedtime.       Allergies  Allergen Reactions  . Latex       The results of significant diagnostics from this hospitalization (including imaging, microbiology, ancillary and laboratory) are listed below for reference.    Significant Diagnostic Studies: Dg Chest 2 View  07/08/2013   CLINICAL DATA:  Preoperative for lumbar fusion, history of diabetes and hypertension. No smoking history.  EXAM: CHEST  2 VIEW  COMPARISON:  None.  FINDINGS: The lungs are adequately inflated. Minimally increased interstitial lung markings at the lung bases are present but are not clearly changed since the previous chest CT scan. There is no alveolar infiltrate or pulmonary parenchymal nodule. The cardiac silhouette is normal in size. The pulmonary vascularity is not engorged. There is no pleural effusion. The observed portions of the bony thorax appear normal.  IMPRESSION: 1. There is no evidence of pneumonia nor CHF. 2. Minimally increased density at the lung bases is consistent with atelectasis or scarring.   Electronically Signed   By: David  [place]   On: 07/08/2013 10:25   Dg Lumbar Spine 2-3 Views  07/19/2013   CLINICAL DATA:  Posterior lumbar interbody fusion.  EXAM: LUMBAR SPINE - 2-3 VIEW; DG C-ARM 1-60 MIN  COMPARISON:  05/27/2013  FINDINGS: Two views of the lower lumbar spine demonstrate bilateral  pedicle screw and rod fixation at L4-L5. There is an interbody device at L4-L5. No evidence for a hardware complication or gross alignment abnormality.  IMPRESSION: L4-L5 fusion.   Electronically Signed   By: Richarda Overlie M.D.   On: 07/19/2013 10:47   Ct Head Wo Contrast  07/23/2013   CLINICAL DATA:  Weakness, confusion  EXAM: CT HEAD WITHOUT CONTRAST  TECHNIQUE: Contiguous axial images were obtained from the base of the skull through the vertex without intravenous contrast.  COMPARISON:  02/20/2009.  FINDINGS: No skull fracture is noted. Paranasal sinuses and mastoid air cells are unremarkable.  No intracranial hemorrhage, mass effect or midline shift.  No acute infarction. No mass lesion is noted on this unenhanced scan. The gray and white-matter differentiation is preserved.  IMPRESSION: No acute intracranial abnormality.   Electronically Signed   By: Natasha Mead M.D.   On: 07/23/2013 16:26   Ct Cervical Spine Wo Contrast  07/24/2013   CLINICAL DATA:  Right upper extremity weakness and numbness.  EXAM: CT CERVICAL SPINE WITHOUT CONTRAST  TECHNIQUE: Multidetector CT imaging of the cervical spine was performed without intravenous contrast. Multiplanar CT image reconstructions were also generated.  COMPARISON:  US CAROTID DUPLEX BILAT dated 07/24/2013; MR MRA HEAD W/O CM dated 07/23/2013; MR HEAD W/O CM dated 07/23/2013; CT HEAD W/O CM dated 07/23/2013  FINDINGS: The head is rotated to the right relative to the cervical spine. There is a mild dextroconvex cervicothoracic curvature. Reversal of the normal cervical lordosis is present with the apex at C5. 2 mm retrolisthesis of C5 on C6 and C6 on C7. 2 mm anterolisthesis of C2 on C3. There is no fracture. The paraspinal soft tissues appear within normal limits. Study is degraded by body habitus.  C2-C3:  No bony stenosis.  Mild left facet hypertrophy.  C3-C4: Severe disc degeneration with shallow disc osteophyte complex. Small left uncovertebral spur with minimal left  foraminal encroachment.  C4-C5: Broad-based disc osteophyte complex producing mild central stenosis. Left-greater-than-right uncovertebral spurring with left foraminal stenosis that potentially affects the left C5 nerve.  C5-C6: Broad-based disc osteophyte complex producing mild to moderate central stenosis. Disc osteophyte complex is left  eccentric. Left-greater-than- right foraminal stenosis due to uncovertebral spurring. This potentially affects left-greater-than-right C6 nerves.  C6-C7: Shallow disc osteophyte complex producing mild central stenosis. Bilateral uncovertebral spurring and facet arthrosis producing left-greater-than-right foraminal stenosis that potentially affects both C7 nerves.  C7-T1: Central canal appears patent. Evaluation of this level is technically degraded by body habitus. No bony stenosis.  IMPRESSION: Moderate to severe multilevel cervical spondylosis detailed above. Multilevel disc osteophyte complexes, uncovertebral spurring and facet degeneration are present. In this patient with right upper extremity radiculopathy, right-sided foraminal stenosis is most pronounced at C6-C7 potentially affecting the right C7 nerve.   Electronically Signed   By: Andreas Newport M.D.   On: 07/24/2013 10:55   Mr Angiogram Head Wo Contrast  07/23/2013   CLINICAL DATA:  53 year old female with right side weakness. Initial encounter.  EXAM: MRI HEAD WITHOUT CONTRAST  MRA HEAD WITHOUT CONTRAST  TECHNIQUE: Multiplanar, multiecho pulse sequences of the brain and surrounding structures were obtained without intravenous contrast. Angiographic images of the head were obtained using MRA technique without contrast.  COMPARISON:  Head CT without contrast 07/23/2013 at 1624 hrs.  FINDINGS: MRI HEAD FINDINGS  Study is intermittently degraded by motion artifact despite repeated imaging attempts.  Cerebral volume is within normal limits for age. No restricted diffusion to suggest acute infarction. No midline shift,  mass effect, evidence of mass lesion, ventriculomegaly, extra-axial collection or acute intracranial hemorrhage. Cervicomedullary junction and pituitary are within normal limits. Major intracranial vascular flow voids are preserved. Negative visualized cervical spine. Normal bone marrow signal. Wallace Cullens and white matter signal is within normal limits throughout the brain.  Grossly normal visualized internal auditory structures. Mastoids are clear. Paranasal sinuses are clear. Visualized orbit soft tissues are within normal limits. Visualized scalp soft tissues are within normal limits.  MRA HEAD FINDINGS  Study is intermittently degraded by motion artifact despite repeated imaging attempts.  Antegrade flow in the posterior circulation with codominant distal vertebral arteries. Normal PICA origins. Tortuous but otherwise normal vertebrobasilar junction. No basilar stenosis. Patent AICA and SCA origins. Normal PCA origins and posterior communicating arteries. Bilateral PCA branches are within normal limits.  Antegrade flow in both ICA siphons. No definite ICA stenosis. Ophthalmic and posterior communicating artery origins are within normal limits.  Normal carotid termini, MCA and ACA origins. Diminutive or absent anterior communicating artery. Detail of the bilateral MCA and ACA branches is mildly degraded by motion. No MCA or ACA branch abnormality identified.  IMPRESSION: 1.  Normal non contrast MRI appearance of the brain. 2. Negative intracranial MRA, allowing for intermittent motion artifact.   Electronically Signed   By: Augusto Gamble M.D.   On: 07/23/2013 19:25   Mr Brain Wo Contrast  07/23/2013   CLINICAL DATA:  53 year old female with right side weakness. Initial encounter.  EXAM: MRI HEAD WITHOUT CONTRAST  MRA HEAD WITHOUT CONTRAST  TECHNIQUE: Multiplanar, multiecho pulse sequences of the brain and surrounding structures were obtained without intravenous contrast. Angiographic images of the head were obtained  using MRA technique without contrast.  COMPARISON:  Head CT without contrast 07/23/2013 at 1624 hrs.  FINDINGS: MRI HEAD FINDINGS  Study is intermittently degraded by motion artifact despite repeated imaging attempts.  Cerebral volume is within normal limits for age. No restricted diffusion to suggest acute infarction. No midline shift, mass effect, evidence of mass lesion, ventriculomegaly, extra-axial collection or acute intracranial hemorrhage. Cervicomedullary junction and pituitary are within normal limits. Major intracranial vascular flow voids are preserved. Negative visualized cervical spine. Normal  bone marrow signal. Wallace Cullens and white matter signal is within normal limits throughout the brain.  Grossly normal visualized internal auditory structures. Mastoids are clear. Paranasal sinuses are clear. Visualized orbit soft tissues are within normal limits. Visualized scalp soft tissues are within normal limits.  MRA HEAD FINDINGS  Study is intermittently degraded by motion artifact despite repeated imaging attempts.  Antegrade flow in the posterior circulation with codominant distal vertebral arteries. Normal PICA origins. Tortuous but otherwise normal vertebrobasilar junction. No basilar stenosis. Patent AICA and SCA origins. Normal PCA origins and posterior communicating arteries. Bilateral PCA branches are within normal limits.  Antegrade flow in both ICA siphons. No definite ICA stenosis. Ophthalmic and posterior communicating artery origins are within normal limits.  Normal carotid termini, MCA and ACA origins. Diminutive or absent anterior communicating artery. Detail of the bilateral MCA and ACA branches is mildly degraded by motion. No MCA or ACA branch abnormality identified.  IMPRESSION: 1.  Normal non contrast MRI appearance of the brain. 2. Negative intracranial MRA, allowing for intermittent motion artifact.   Electronically Signed   By: Augusto Gamble M.D.   On: 07/23/2013 19:25   US Carotid Duplex  Bilateral  07/24/2013   CLINICAL DATA:  TIA.  EXAM: BILATERAL CAROTID DUPLEX ULTRASOUND  TECHNIQUE: Wallace Cullens scale imaging, color Doppler and duplex ultrasound were performed of bilateral carotid and vertebral arteries in the neck.  COMPARISON:  None.  FINDINGS: Criteria: Quantification of carotid stenosis is based on velocity parameters that correlate the residual internal carotid diameter with NASCET-based stenosis levels, using the diameter of the distal internal carotid lumen as the denominator for stenosis measurement.  The following velocity measurements were obtained:  RIGHT  ICA:  392 cm/sec  CCA:  117 cm/sec  SYSTOLIC ICA/CCA RATIO:  3.4  DIASTOLIC ICA/CCA RATIO:  5.9  ECA:  183 cm/sec  LEFT  ICA:  211 cm/sec  CCA:  160 cm/sec  SYSTOLIC ICA/CCA RATIO:  1.3  DIASTOLIC ICA/CCA RATIO:  1.8  ECA:  151 cm/sec  RIGHT CAROTID ARTERY: The right carotid arteries are patent. There is mild plaque or intimal thickening in the proximal internal carotid artery but there does not appear to be focal stenosis. However, the peak systolic velocity in the proximal internal carotid artery is markedly elevated, measuring 392 cm/sec. This velocity measurement is discrepant with the grayscale imaging findings. Velocity measurements in the mid and distal internal carotid artery are close to normal. The mid right internal carotid artery peak systolic velocity measures 137 cm/sec.  RIGHT VERTEBRAL ARTERY: Antegrade flow and normal waveform in the right vertebral artery.  LEFT CAROTID ARTERY: Mild intimal thickening in the left carotid arteries. There is plaque in the proximal left internal carotid artery. Peak systolic velocity in the proximal internal carotid artery measures 211 cm/sec.  LEFT VERTEBRAL ARTERY: Antegrade flow and normal waveform in the left vertebral artery.  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 The velocity  measurements in the proximal right internal carotid artery are markedly elevated and suggest a critical stenosis but this does not correlate with the grayscale imaging. The velocity measurements in the left internal carotid artery suggests a stenosis between 50-69%. Due to the discrepant findings between the velocity measurements and grayscale imaging, recommend further characterization of the carotid arteries with MRA or CTA.  Patent vertebral arteries.   Electronically Signed   By: Richarda Overlie M.D.   On: 07/24/2013 10:40  Dg C-arm 1-60 Min  07/19/2013   CLINICAL DATA:  Posterior lumbar interbody fusion.  EXAM: LUMBAR SPINE - 2-3 VIEW; DG C-ARM 1-60 MIN  COMPARISON:  05/27/2013  FINDINGS: Two views of the lower lumbar spine demonstrate bilateral pedicle screw and rod fixation at L4-L5. There is an interbody device at L4-L5. No evidence for a hardware complication or gross alignment abnormality.  IMPRESSION: L4-L5 fusion.   Electronically Signed   By: Richarda Overlie M.D.   On: 07/19/2013 10:47    Microbiology: No results found for this or any previous visit (from the past 240 hour(s)).   Labs: Basic Metabolic Panel:  Recent Labs Lab 07/23/13 1613 07/24/13 0818 07/25/13 0602  NA 129* 133* 138  K 6.3* 4.9 4.0  CL 95* 97 103  CO2 19 19 23   GLUCOSE 324* 267* 215*  BUN 34* 35* 25*  CREATININE 2.36* 1.83* 0.79  CALCIUM 9.4 9.5 9.0   Liver Function Tests:  Recent Labs Lab 07/23/13 1613  AST 110*  ALT 55*  ALKPHOS 154*  BILITOT 0.3  PROT 8.6*  ALBUMIN 3.3*   No results found for this basename: LIPASE, AMYLASE,  in the last 168 hours No results found for this basename: AMMONIA,  in the last 168 hours CBC:  Recent Labs Lab 07/23/13 1613  WBC 12.1*  NEUTROABS 10.1*  HGB 12.1  HCT 36.8  MCV 85.8  PLT 272   Cardiac Enzymes:  Recent Labs Lab 07/23/13 1613  TROPONINI <0.30   BNP: BNP (last 3 results) No results found for this basename: PROBNP,  in the last 8760  hours CBG:  Recent Labs Lab 07/24/13 1130 07/24/13 1651 07/24/13 2219 07/25/13 0735 07/25/13 1140  GLUCAP 303* 124* 203* 230* 184*       Signed:  Lee-Anne Flicker  [facility] 07/25/2013, 12:00 PM

## 2013-07-25 NOTE — Progress Notes (Signed)
Patient discharged home.  Friend here for ride home.  IV removed - WNL.  Home Health set up with [facility].  [facility] to [facility] as [facility] the [facility] first choice - was told that they do not have enough staff at this time and are unable to accept new case.  Orders faxed to [facility] with patient permission.  Patient instructed on new meds and changes to previous ones.  Instructed to make follow up with Dr. Jordan LikesPool in 1-2 weeks.  Verbalizes understanding.  No questions at this time.  Stable to DC, left floor via WC with assist of NT.

## 2013-07-27 NOTE — Care Management Utilization Note (Signed)
UR completed 

## 2013-08-17 ENCOUNTER — Inpatient Hospital Stay (HOSPITAL_COMMUNITY): Payer: Medicaid Other

## 2013-08-17 ENCOUNTER — Emergency Department (HOSPITAL_COMMUNITY): Payer: Medicaid Other

## 2013-08-17 ENCOUNTER — Encounter (HOSPITAL_COMMUNITY): Payer: Self-pay | Admitting: Emergency Medicine

## 2013-08-17 ENCOUNTER — Inpatient Hospital Stay (HOSPITAL_COMMUNITY)
Admission: EM | Admit: 2013-08-17 | Discharge: 2013-08-23 | DRG: 917 | Disposition: A | Discharge status: Home / Self Care | Payer: Medicaid Other | Attending: Pulmonary Disease | Admitting: Pulmonary Disease

## 2013-08-17 DIAGNOSIS — J9690 Respiratory failure, unspecified, unspecified whether with hypoxia or hypercapnia: Secondary | ICD-10-CM

## 2013-08-17 DIAGNOSIS — N179 Acute kidney failure, unspecified: Secondary | ICD-10-CM

## 2013-08-17 DIAGNOSIS — R7989 Other specified abnormal findings of blood chemistry: Secondary | ICD-10-CM

## 2013-08-17 DIAGNOSIS — E7800 Pure hypercholesterolemia, unspecified: Secondary | ICD-10-CM | POA: Diagnosis present

## 2013-08-17 DIAGNOSIS — Z9181 History of falling: Secondary | ICD-10-CM

## 2013-08-17 DIAGNOSIS — F411 Generalized anxiety disorder: Secondary | ICD-10-CM | POA: Diagnosis present

## 2013-08-17 DIAGNOSIS — D509 Iron deficiency anemia, unspecified: Secondary | ICD-10-CM | POA: Diagnosis present

## 2013-08-17 DIAGNOSIS — E785 Hyperlipidemia, unspecified: Secondary | ICD-10-CM | POA: Diagnosis present

## 2013-08-17 DIAGNOSIS — R5381 Other malaise: Secondary | ICD-10-CM | POA: Diagnosis present

## 2013-08-17 DIAGNOSIS — A419 Sepsis, unspecified organism: Secondary | ICD-10-CM | POA: Diagnosis present

## 2013-08-17 DIAGNOSIS — T400X1A Poisoning by opium, accidental (unintentional), initial encounter: Principal | ICD-10-CM | POA: Diagnosis present

## 2013-08-17 DIAGNOSIS — F3289 Other specified depressive episodes: Secondary | ICD-10-CM | POA: Diagnosis present

## 2013-08-17 DIAGNOSIS — E875 Hyperkalemia: Secondary | ICD-10-CM | POA: Diagnosis not present

## 2013-08-17 DIAGNOSIS — Z7982 Long term (current) use of aspirin: Secondary | ICD-10-CM

## 2013-08-17 DIAGNOSIS — T40601A Poisoning by unspecified narcotics, accidental (unintentional), initial encounter: Secondary | ICD-10-CM

## 2013-08-17 DIAGNOSIS — E139 Other specified diabetes mellitus without complications: Secondary | ICD-10-CM

## 2013-08-17 DIAGNOSIS — D649 Anemia, unspecified: Secondary | ICD-10-CM

## 2013-08-17 DIAGNOSIS — M6282 Rhabdomyolysis: Secondary | ICD-10-CM | POA: Diagnosis present

## 2013-08-17 DIAGNOSIS — I10 Essential (primary) hypertension: Secondary | ICD-10-CM | POA: Diagnosis present

## 2013-08-17 DIAGNOSIS — E872 Acidosis, unspecified: Secondary | ICD-10-CM

## 2013-08-17 DIAGNOSIS — F329 Major depressive disorder, single episode, unspecified: Secondary | ICD-10-CM | POA: Diagnosis present

## 2013-08-17 DIAGNOSIS — J9600 Acute respiratory failure, unspecified whether with hypoxia or hypercapnia: Secondary | ICD-10-CM | POA: Diagnosis present

## 2013-08-17 DIAGNOSIS — G9340 Encephalopathy, unspecified: Secondary | ICD-10-CM

## 2013-08-17 DIAGNOSIS — Z981 Arthrodesis status: Secondary | ICD-10-CM | POA: Diagnosis not present

## 2013-08-17 DIAGNOSIS — K219 Gastro-esophageal reflux disease without esophagitis: Secondary | ICD-10-CM | POA: Diagnosis present

## 2013-08-17 DIAGNOSIS — E119 Type 2 diabetes mellitus without complications: Secondary | ICD-10-CM

## 2013-08-17 DIAGNOSIS — N19 Unspecified kidney failure: Secondary | ICD-10-CM

## 2013-08-17 DIAGNOSIS — M129 Arthropathy, unspecified: Secondary | ICD-10-CM | POA: Diagnosis present

## 2013-08-17 LAB — BLOOD GAS, ARTERIAL
ACID-BASE DEFICIT: 9.9 mmol/L — AB (ref 0.0–2.0)
Acid-base deficit: 8.3 mmol/L — ABNORMAL HIGH (ref 0.0–2.0)
BICARBONATE: 17.5 meq/L — AB (ref 20.0–24.0)
Bicarbonate: 16 mEq/L — ABNORMAL LOW (ref 20.0–24.0)
DRAWN BY: 11249
Drawn by: 317771
FIO2: 0.21 %
FIO2: 1 %
MECHVT: 500 mL
O2 SAT: 90.9 %
O2 SAT: 100 %
PEEP/CPAP: 5 cmH2O
PH ART: 7.364 (ref 7.350–7.450)
PO2 ART: 424 mmHg — AB (ref 80.0–100.0)
Patient temperature: 37
Patient temperature: 98.6
RATE: 25 resp/min
TCO2: 16.7 mmol/L (ref 0–100)
TCO2: 16.9 mmol/L (ref 0–100)
pCO2 arterial: 52.7 mmHg — ABNORMAL HIGH (ref 35.0–45.0)
pCO2 arterial: 28.8 mmHg — ABNORMAL LOW (ref 35.0–45.0)
pH, Arterial: 7.147 — CL (ref 7.350–7.450)
pO2, Arterial: 73.6 mmHg — ABNORMAL LOW (ref 80.0–100.0)

## 2013-08-17 LAB — CBG MONITORING, ED
Glucose-Capillary: 131 mg/dL — ABNORMAL HIGH (ref 70–99)
Glucose-Capillary: 132 mg/dL — ABNORMAL HIGH (ref 70–99)

## 2013-08-17 LAB — CBC WITH DIFFERENTIAL/PLATELET
Basophils Absolute: 0 10*3/uL (ref 0.0–0.1)
Basophils Relative: 0 % (ref 0–1)
EOS PCT: 0 % (ref 0–5)
Eosinophils Absolute: 0 10*3/uL (ref 0.0–0.7)
HEMATOCRIT: 24.6 % — AB (ref 36.0–46.0)
Hemoglobin: 8 g/dL — ABNORMAL LOW (ref 12.0–15.0)
LYMPHS ABS: 1.6 10*3/uL (ref 0.7–4.0)
Lymphocytes Relative: 16 % (ref 12–46)
MCH: 27.9 pg (ref 26.0–34.0)
MCHC: 32.5 g/dL (ref 30.0–36.0)
MCV: 85.7 fL (ref 78.0–100.0)
MONO ABS: 0.7 10*3/uL (ref 0.1–1.0)
MONOS PCT: 7 % (ref 3–12)
Neutro Abs: 7.8 10*3/uL — ABNORMAL HIGH (ref 1.7–7.7)
Neutrophils Relative %: 77 % (ref 43–77)
Platelets: 397 10*3/uL (ref 150–400)
RBC: 2.87 MIL/uL — ABNORMAL LOW (ref 3.87–5.11)
RDW: 14 % (ref 11.5–15.5)
WBC: 10.1 10*3/uL (ref 4.0–10.5)

## 2013-08-17 LAB — COMPREHENSIVE METABOLIC PANEL
ALBUMIN: 3 g/dL — AB (ref 3.5–5.2)
ALT: 15 U/L (ref 0–35)
AST: 34 U/L (ref 0–37)
Alkaline Phosphatase: 108 U/L (ref 39–117)
BUN: 66 mg/dL — AB (ref 6–23)
CALCIUM: 9 mg/dL (ref 8.4–10.5)
CO2: 19 mEq/L (ref 19–32)
Chloride: 100 mEq/L (ref 96–112)
Creatinine, Ser: 6.59 mg/dL — ABNORMAL HIGH (ref 0.50–1.10)
GFR calc non Af Amer: 6 mL/min — ABNORMAL LOW (ref >90)
GFR, EST AFRICAN AMERICAN: 7 mL/min — AB (ref >90)
GLUCOSE: 130 mg/dL — AB (ref 70–99)
Potassium: 6 mEq/L — ABNORMAL HIGH (ref 3.7–5.3)
SODIUM: 138 meq/L (ref 137–147)
TOTAL PROTEIN: 7.3 g/dL (ref 6.0–8.3)
Total Bilirubin: 0.2 mg/dL — ABNORMAL LOW (ref 0.3–1.2)

## 2013-08-17 LAB — URINALYSIS, ROUTINE W REFLEX MICROSCOPIC
GLUCOSE, UA: NEGATIVE mg/dL
Ketones, ur: NEGATIVE mg/dL
LEUKOCYTES UA: NEGATIVE
NITRITE: NEGATIVE
PH: 5 (ref 5.0–8.0)
Protein, ur: 30 mg/dL — AB
Urobilinogen, UA: 0.2 mg/dL (ref 0.0–1.0)

## 2013-08-17 LAB — BASIC METABOLIC PANEL
BUN: 59 mg/dL — ABNORMAL HIGH (ref 6–23)
CO2: 16 mEq/L — ABNORMAL LOW (ref 19–32)
Calcium: 8.2 mg/dL — ABNORMAL LOW (ref 8.4–10.5)
Chloride: 104 mEq/L (ref 96–112)
Creatinine, Ser: 5.27 mg/dL — ABNORMAL HIGH (ref 0.50–1.10)
GFR calc Af Amer: 10 mL/min — ABNORMAL LOW (ref >90)
GFR, EST NON AFRICAN AMERICAN: 8 mL/min — AB (ref >90)
GLUCOSE: 107 mg/dL — AB (ref 70–99)
POTASSIUM: 5 meq/L (ref 3.7–5.3)
SODIUM: 139 meq/L (ref 137–147)

## 2013-08-17 LAB — RAPID URINE DRUG SCREEN, HOSP PERFORMED
AMPHETAMINES: NOT DETECTED
Barbiturates: NOT DETECTED
Benzodiazepines: POSITIVE — AB
Cocaine: NOT DETECTED
Opiates: POSITIVE — AB
Tetrahydrocannabinol: NOT DETECTED

## 2013-08-17 LAB — PROTIME-INR
INR: 1.14 (ref 0.00–1.49)
Prothrombin Time: 14.4 seconds (ref 11.6–15.2)

## 2013-08-17 LAB — LACTIC ACID, PLASMA: Lactic Acid, Venous: 1.1 mmol/L (ref 0.5–2.2)

## 2013-08-17 LAB — PROCALCITONIN: PROCALCITONIN: 2.24 ng/mL

## 2013-08-17 LAB — ACETAMINOPHEN LEVEL

## 2013-08-17 LAB — URINE MICROSCOPIC-ADD ON

## 2013-08-17 LAB — CK: CK TOTAL: 2537 U/L — AB (ref 7–177)

## 2013-08-17 LAB — SALICYLATE LEVEL

## 2013-08-17 MED ORDER — SODIUM BICARBONATE 8.4 % IV SOLN
100.0000 meq | Freq: Once | INTRAVENOUS | Status: AC
  Administered 2013-08-17: 100 meq via INTRAVENOUS
  Filled 2013-08-17: qty 50

## 2013-08-17 MED ORDER — SUCCINYLCHOLINE CHLORIDE 20 MG/ML IJ SOLN
INTRAMUSCULAR | Status: AC
  Filled 2013-08-17: qty 1

## 2013-08-17 MED ORDER — ETOMIDATE 2 MG/ML IV SOLN
INTRAVENOUS | Status: AC
Start: 2013-08-17 — End: 2013-08-18
  Filled 2013-08-17: qty 20

## 2013-08-17 MED ORDER — SODIUM CHLORIDE 0.9 % IV SOLN
1000.0000 mL | Freq: Once | INTRAVENOUS | Status: AC
  Administered 2013-08-17: 1000 mL via INTRAVENOUS

## 2013-08-17 MED ORDER — VANCOMYCIN HCL IN DEXTROSE 1-5 GM/200ML-% IV SOLN
1000.0000 mg | Freq: Once | INTRAVENOUS | Status: AC
  Administered 2013-08-17: 1000 mg via INTRAVENOUS
  Filled 2013-08-17: qty 200

## 2013-08-17 MED ORDER — SODIUM CHLORIDE 0.9 % IV SOLN: 250.0000 mL | INTRAVENOUS | Status: DC | PRN

## 2013-08-17 MED ORDER — ROCURONIUM BROMIDE 50 MG/5ML IV SOLN
100.0000 mg | Freq: Once | INTRAVENOUS | Status: AC
  Administered 2013-08-17: 100 mg via INTRAVENOUS
  Filled 2013-08-17: qty 10

## 2013-08-17 MED ORDER — PROPOFOL 10 MG/ML IV EMUL
INTRAVENOUS | Status: AC
  Filled 2013-08-17: qty 100

## 2013-08-17 MED ORDER — MORPHINE SULFATE 2 MG/ML IJ SOLN
2.0000 mg | Freq: Once | INTRAMUSCULAR | Status: AC
  Administered 2013-08-17: 2 mg via INTRAVENOUS

## 2013-08-17 MED ORDER — NOREPINEPHRINE BITARTRATE 1 MG/ML IJ SOLN
2.0000 ug/min | INTRAVENOUS | Status: DC
  Administered 2013-08-18: 7 ug/min via INTRAVENOUS
  Administered 2013-08-18: 6 ug/min via INTRAVENOUS
  Filled 2013-08-17 (×4): qty 4

## 2013-08-17 MED ORDER — LIDOCAINE HCL (CARDIAC) 20 MG/ML IV SOLN
INTRAVENOUS | Status: AC
  Filled 2013-08-17: qty 5

## 2013-08-17 MED ORDER — VANCOMYCIN HCL IN DEXTROSE 1-5 GM/200ML-% IV SOLN
1000.0000 mg | Freq: Once | INTRAVENOUS | Status: DC
  Filled 2013-08-17: qty 200

## 2013-08-17 MED ORDER — STERILE WATER FOR INJECTION IV SOLN
INTRAVENOUS | Status: DC
  Administered 2013-08-17: 21:00:00 via INTRAVENOUS
  Filled 2013-08-17 (×5): qty 850

## 2013-08-17 MED ORDER — LORAZEPAM 2 MG/ML IJ SOLN
INTRAMUSCULAR | Status: AC
Start: 2013-08-17 — End: 2013-08-18
  Filled 2013-08-17: qty 1

## 2013-08-17 MED ORDER — SODIUM BICARBONATE 8.4 % IV SOLN
INTRAVENOUS | Status: AC
  Filled 2013-08-17: qty 150

## 2013-08-17 MED ORDER — PIPERACILLIN-TAZOBACTAM 3.375 G IVPB 30 MIN
3.3750 g | Freq: Once | INTRAVENOUS | Status: DC
  Administered 2013-08-17: 3.375 g via INTRAVENOUS
  Filled 2013-08-17 (×2): qty 50

## 2013-08-17 MED ORDER — LORAZEPAM 2 MG/ML IJ SOLN: 0.5000 mg | Freq: Once | INTRAMUSCULAR | Status: DC

## 2013-08-17 MED ORDER — SODIUM CHLORIDE 0.9 % IV SOLN: INTRAVENOUS | Status: DC

## 2013-08-17 MED ORDER — SODIUM CHLORIDE 0.9 % IV SOLN
INTRAVENOUS | Status: AC
  Administered 2013-08-18: 04:00:00 via INTRAVENOUS
  Administered 2013-08-18: 250 mL/h via INTRAVENOUS

## 2013-08-17 MED ORDER — ROCURONIUM BROMIDE 50 MG/5ML IV SOLN
INTRAVENOUS | Status: AC
Start: 2013-08-17 — End: 2013-08-18
  Filled 2013-08-17: qty 2

## 2013-08-17 MED ORDER — FAMOTIDINE IN NACL 20-0.9 MG/50ML-% IV SOLN
20.0000 mg | Freq: Two times a day (BID) | INTRAVENOUS | Status: DC
  Administered 2013-08-18: 20 mg via INTRAVENOUS
  Filled 2013-08-17 (×3): qty 50

## 2013-08-17 MED ORDER — SODIUM CHLORIDE 0.9 % IV SOLN
INTRAVENOUS | Status: AC
  Administered 2013-08-17: 21:00:00 via INTRAVENOUS

## 2013-08-17 MED ORDER — SODIUM CHLORIDE 0.9 % IV SOLN
1000.0000 mL | INTRAVENOUS | Status: DC
  Administered 2013-08-17: 1000 mL via INTRAVENOUS

## 2013-08-17 MED ORDER — HEPARIN SODIUM (PORCINE) 5000 UNIT/ML IJ SOLN
5000.0000 [IU] | Freq: Three times a day (TID) | INTRAMUSCULAR | Status: DC
Start: 2013-08-18 — End: 2013-08-23
  Administered 2013-08-18 – 2013-08-23 (×14): 5000 [IU] via SUBCUTANEOUS
  Filled 2013-08-17 (×19): qty 1

## 2013-08-17 MED ORDER — PROPOFOL 10 MG/ML IV EMUL
5.0000 ug/kg/min | INTRAVENOUS | Status: DC
  Administered 2013-08-17: 5 ug/kg/min via INTRAVENOUS

## 2013-08-17 MED ORDER — MORPHINE SULFATE 2 MG/ML IJ SOLN
INTRAMUSCULAR | Status: AC
  Filled 2013-08-17: qty 1

## 2013-08-17 MED ORDER — PIPERACILLIN-TAZOBACTAM IN DEX 2-0.25 GM/50ML IV SOLN
2.2500 g | Freq: Three times a day (TID) | INTRAVENOUS | Status: DC
  Administered 2013-08-18 – 2013-08-19 (×4): 2.25 g via INTRAVENOUS
  Filled 2013-08-17 (×11): qty 50

## 2013-08-17 MED ORDER — SODIUM CHLORIDE 0.9 % IV SOLN: 1000.0000 mL | INTRAVENOUS | Status: DC

## 2013-08-17 MED ORDER — ETOMIDATE 2 MG/ML IV SOLN
20.0000 mg | Freq: Once | INTRAVENOUS | Status: AC
  Administered 2013-08-17: 20 mg via INTRAVENOUS

## 2013-08-17 NOTE — ED Notes (Signed)
7.5 ET tube placed 24 at lip.
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
## 2013-08-17 NOTE — ED Notes (Signed)
Friend reports she had been trying to call pt all day but pt wouldn't answer.  Friend went to her house and found her laying in the hallway.  Friend thinks pt took too many pain pills.  Pt says wasn't trying to kill herself but thought the pain medication would make her feel better.  Reports had back surgery last month.

## 2013-08-17 NOTE — Progress Notes (Signed)
ANTIBIOTIC CONSULT NOTE - INITIAL  Pharmacy Consult for Vancomycin & Zosyn Indication: sepsis  Allergies  Allergen Reactions  . Latex     Patient Measurements: Weight: 189 lb (85.73 kg) Adjusted Body Weight:   Vital Signs: Temp: 99.5 F (37.5 C) (04/21 2230) Temp src: Core (Comment) (04/21 2230) BP: 102/64 mmHg (04/21 2230) Pulse Rate: 102 (04/21 2230) Intake/Output from previous day:   Intake/Output from this shift: Total I/O In: -  Out: 890 [Urine:890]  Labs:  Recent Labs  08/17/13 1816 08/17/13 2020  WBC 10.1  --   HGB 8.0*  --   PLT 397  --   CREATININE 6.59* 5.27*   The CrCl is unknown because both a height and weight (above a minimum accepted value) are required for this calculation. No results found for this basename: VANCOTROUGH, VANCOPEAK, VANCORANDOM, GENTTROUGH, GENTPEAK, GENTRANDOM, TOBRATROUGH, TOBRAPEAK, TOBRARND, AMIKACINPEAK, AMIKACINTROU, AMIKACIN,  in the last 72 hours   Microbiology: Recent Results (from the past 720 hour(s))  CULTURE, BLOOD (ROUTINE X 2)     Status: None   Collection Time    08/17/13  6:16 PM      Result Value Ref Range Status   Specimen Description BLOOD RIGHT HAND   Final   Special Requests BOTTLES DRAWN AEROBIC AND ANAEROBIC 4CC EACH   Final   Culture PENDING   Incomplete   Report Status PENDING   Incomplete  CULTURE, BLOOD (ROUTINE X 2)     Status: None   Collection Time    08/17/13  6:21 PM      Result Value Ref Range Status   Specimen Description BLOOD NECK   Final   Special Requests     Final   Value: BOTTLES DRAWN AEROBIC AND ANAEROBIC AEB 4CC ANA 2CC   Culture PENDING   Incomplete   Report Status PENDING   Incomplete    Medical History: Past Medical History  Diagnosis Date  . Diabetes mellitus   . Hypertension   . High cholesterol   . Depression   . Anxiety   . Shortness of breath     with exertion  . Arthritis   . Nasal fracture     X 3, last ~ 2000; non-surgical management     Medications:   Scheduled:  . sodium chloride   Intravenous STAT  . lidocaine (cardiac) 100 mg/795ml      . LORazepam  0.5 mg Intravenous Once  . piperacillin-tazobactam (ZOSYN)  IV  2.25 g Intravenous Q8H  . succinylcholine       Assessment: Labs reviewed PTA medications reviewed Reduced renal function, SCR 5.27  Goal of Therapy:  Vancomycin trough level 15-20 mcg/ml  Plan:  Zosyn 2.25 GM IV every 8 hours Vancomycin 1 GM IV x 1 dose F/U additional Vancomycin dosing Monitor renal function  Vasil Juhasz [employer] States Steel CorporationBennett Nomie Buchberger 08/17/2013,10:52 PM
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
## 2013-08-17 NOTE — ED Notes (Signed)
Pt disoriented, unable to sit still in triage.

## 2013-08-18 ENCOUNTER — Inpatient Hospital Stay (HOSPITAL_COMMUNITY): Payer: Medicaid Other

## 2013-08-18 DIAGNOSIS — T426X4A Poisoning by other antiepileptic and sedative-hypnotic drugs, undetermined, initial encounter: Secondary | ICD-10-CM

## 2013-08-18 DIAGNOSIS — E872 Acidosis, unspecified: Secondary | ICD-10-CM

## 2013-08-18 DIAGNOSIS — G9340 Encephalopathy, unspecified: Secondary | ICD-10-CM | POA: Diagnosis present

## 2013-08-18 DIAGNOSIS — Z09 Encounter for follow-up examination after completed treatment for conditions other than malignant neoplasm: Secondary | ICD-10-CM

## 2013-08-18 DIAGNOSIS — T40601A Poisoning by unspecified narcotics, accidental (unintentional), initial encounter: Secondary | ICD-10-CM | POA: Diagnosis present

## 2013-08-18 DIAGNOSIS — R799 Abnormal finding of blood chemistry, unspecified: Secondary | ICD-10-CM

## 2013-08-18 DIAGNOSIS — T4271XA Poisoning by unspecified antiepileptic and sedative-hypnotic drugs, accidental (unintentional), initial encounter: Secondary | ICD-10-CM

## 2013-08-18 DIAGNOSIS — N179 Acute kidney failure, unspecified: Secondary | ICD-10-CM | POA: Diagnosis present

## 2013-08-18 DIAGNOSIS — M6282 Rhabdomyolysis: Secondary | ICD-10-CM

## 2013-08-18 LAB — BLOOD GAS, ARTERIAL
ACID-BASE DEFICIT: 8.2 mmol/L — AB (ref 0.0–2.0)
Acid-base deficit: 8.6 mmol/L — ABNORMAL HIGH (ref 0.0–2.0)
Bicarbonate: 17.4 mEq/L — ABNORMAL LOW (ref 20.0–24.0)
Bicarbonate: 16 mEq/L — ABNORMAL LOW (ref 20.0–24.0)
DRAWN BY: 11249
DRAWN BY: 11249
FIO2: 60 %
FIO2: 50 %
LHR: 16 {breaths}/min
O2 SAT: 99.3 %
O2 Saturation: 99 %
PCO2 ART: 41.8 mmHg (ref 35.0–45.0)
PEEP: 5 cmH2O
PEEP: 5 cmH2O
PH ART: 7.243 — AB (ref 7.350–7.450)
PO2 ART: 157 mmHg — AB (ref 80.0–100.0)
PO2 ART: 149 mmHg — AB (ref 80.0–100.0)
Patient temperature: 98.6
Patient temperature: 98.6
RATE: 22 resp/min
TCO2: 18.7 mmol/L (ref 0–100)
TCO2: 16.8 mmol/L (ref 0–100)
VT: 420 mL
VT: 550 mL
pCO2 arterial: 27.7 mmHg — ABNORMAL LOW (ref 35.0–45.0)
pH, Arterial: 7.378 (ref 7.350–7.450)

## 2013-08-18 LAB — CBC
HEMATOCRIT: 24.2 % — AB (ref 36.0–46.0)
HEMATOCRIT: 25.8 % — AB (ref 36.0–46.0)
HEMOGLOBIN: 7.8 g/dL — AB (ref 12.0–15.0)
Hemoglobin: 8.1 g/dL — ABNORMAL LOW (ref 12.0–15.0)
MCH: 26.6 pg (ref 26.0–34.0)
MCH: 27.1 pg (ref 26.0–34.0)
MCHC: 31.4 g/dL (ref 30.0–36.0)
MCHC: 32.2 g/dL (ref 30.0–36.0)
MCV: 84.6 fL (ref 78.0–100.0)
MCV: 84 fL (ref 78.0–100.0)
Platelets: 379 10*3/uL (ref 150–400)
Platelets: 441 10*3/uL — ABNORMAL HIGH (ref 150–400)
RBC: 3.05 MIL/uL — ABNORMAL LOW (ref 3.87–5.11)
RBC: 2.88 MIL/uL — AB (ref 3.87–5.11)
RDW: 14 % (ref 11.5–15.5)
RDW: 14.1 % (ref 11.5–15.5)
WBC: 10.3 10*3/uL (ref 4.0–10.5)
WBC: 14.6 10*3/uL — ABNORMAL HIGH (ref 4.0–10.5)

## 2013-08-18 LAB — GLUCOSE, CAPILLARY
GLUCOSE-CAPILLARY: 123 mg/dL — AB (ref 70–99)
GLUCOSE-CAPILLARY: 158 mg/dL — AB (ref 70–99)
GLUCOSE-CAPILLARY: 95 mg/dL (ref 70–99)
Glucose-Capillary: 170 mg/dL — ABNORMAL HIGH (ref 70–99)
Glucose-Capillary: 169 mg/dL — ABNORMAL HIGH (ref 70–99)
Glucose-Capillary: 123 mg/dL — ABNORMAL HIGH (ref 70–99)
Glucose-Capillary: 142 mg/dL — ABNORMAL HIGH (ref 70–99)

## 2013-08-18 LAB — TROPONIN I
Troponin I: <0.3 ng/mL (ref <0.30)
Troponin I: <0.3 ng/mL (ref <0.30)

## 2013-08-18 LAB — BASIC METABOLIC PANEL
BUN: 46 mg/dL — AB (ref 6–23)
BUN: 22 mg/dL (ref 6–23)
CO2: 17 mEq/L — ABNORMAL LOW (ref 19–32)
CO2: 18 mEq/L — ABNORMAL LOW (ref 19–32)
Calcium: 8.1 mg/dL — ABNORMAL LOW (ref 8.4–10.5)
Calcium: 6.3 mg/dL — CL (ref 8.4–10.5)
Chloride: 111 mEq/L (ref 96–112)
Chloride: 114 mEq/L — ABNORMAL HIGH (ref 96–112)
Creatinine, Ser: 2.57 mg/dL — ABNORMAL HIGH (ref 0.50–1.10)
Creatinine, Ser: 0.89 mg/dL (ref 0.50–1.10)
GFR calc Af Amer: 23 mL/min — ABNORMAL LOW (ref >90)
GFR, EST AFRICAN AMERICAN: 84 mL/min — AB (ref >90)
GFR, EST NON AFRICAN AMERICAN: 20 mL/min — AB (ref >90)
GFR, EST NON AFRICAN AMERICAN: 72 mL/min — AB (ref >90)
GLUCOSE: 117 mg/dL — AB (ref 70–99)
Glucose, Bld: 148 mg/dL — ABNORMAL HIGH (ref 70–99)
POTASSIUM: 4.3 meq/L (ref 3.7–5.3)
Potassium: 2.7 mEq/L — CL (ref 3.7–5.3)
SODIUM: 145 meq/L (ref 137–147)
Sodium: 146 mEq/L (ref 137–147)

## 2013-08-18 LAB — IRON AND TIBC
Iron: 16 ug/dL — ABNORMAL LOW (ref 42–135)
Saturation Ratios: 8 % — ABNORMAL LOW (ref 20–55)
TIBC: 190 ug/dL — ABNORMAL LOW (ref 250–470)
UIBC: 174 ug/dL (ref 125–400)

## 2013-08-18 LAB — TYPE AND SCREEN
ABO/RH(D): O POS
Antibody Screen: NEGATIVE

## 2013-08-18 LAB — CK: Total CK: 1419 U/L — ABNORMAL HIGH (ref 7–177)

## 2013-08-18 LAB — POCT I-STAT 3, ART BLOOD GAS (G3+)
ACID-BASE EXCESS: 1 mmol/L (ref 0.0–2.0)
BICARBONATE: 26.9 meq/L — AB (ref 20.0–24.0)
O2 SAT: 99 %
PO2 ART: 142 mmHg — AB (ref 80.0–100.0)
Patient temperature: 36.8
TCO2: 28 mmol/L (ref 0–100)
pCO2 arterial: 47.9 mmHg — ABNORMAL HIGH (ref 35.0–45.0)
pH, Arterial: 7.357 (ref 7.350–7.450)

## 2013-08-18 LAB — TRIGLYCERIDES: TRIGLYCERIDES: 215 mg/dL — AB (ref <150)

## 2013-08-18 LAB — PHOSPHORUS
Phosphorus: 3.7 mg/dL (ref 2.3–4.6)
Phosphorus: 4.3 mg/dL (ref 2.3–4.6)

## 2013-08-18 LAB — MAGNESIUM
Magnesium: 1.3 mg/dL — ABNORMAL LOW (ref 1.5–2.5)
Magnesium: 2.1 mg/dL (ref 1.5–2.5)

## 2013-08-18 LAB — MRSA PCR SCREENING: MRSA by PCR: NEGATIVE

## 2013-08-18 LAB — FERRITIN: Ferritin: 260 ng/mL (ref 10–291)

## 2013-08-18 LAB — PROCALCITONIN: PROCALCITONIN: 0.8 ng/mL

## 2013-08-18 MED ORDER — FENTANYL CITRATE 0.05 MG/ML IJ SOLN
0.0000 ug/h | INTRAMUSCULAR | Status: DC
  Administered 2013-08-19: 50 ug/h via INTRAVENOUS
  Filled 2013-08-18 (×2): qty 50

## 2013-08-18 MED ORDER — BIOTENE DRY MOUTH MT LIQD
15.0000 mL | Freq: Four times a day (QID) | OROMUCOSAL | Status: DC
  Administered 2013-08-18 – 2013-08-23 (×19): 15 mL via OROMUCOSAL

## 2013-08-18 MED ORDER — PROPOFOL 10 MG/ML IV EMUL
0.0000 ug/kg/min | INTRAVENOUS | Status: DC
  Administered 2013-08-18: 50 ug/kg/min via INTRAVENOUS
  Administered 2013-08-18: 30 ug/kg/min via INTRAVENOUS
  Administered 2013-08-18 (×2): 50 ug/kg/min via INTRAVENOUS
  Administered 2013-08-18: 40 ug/kg/min via INTRAVENOUS
  Administered 2013-08-19 (×2): 50 ug/kg/min via INTRAVENOUS
  Filled 2013-08-18 (×6): qty 100

## 2013-08-18 MED ORDER — VITAL HIGH PROTEIN PO LIQD
1000.0000 mL | ORAL | Status: DC
  Administered 2013-08-18: 1000 mL
  Filled 2013-08-18 (×3): qty 1000

## 2013-08-18 MED ORDER — FENTANYL CITRATE 0.05 MG/ML IJ SOLN
100.0000 ug | INTRAMUSCULAR | Status: AC | PRN
  Administered 2013-08-18 (×3): 100 ug via INTRAVENOUS
  Filled 2013-08-18 (×3): qty 2

## 2013-08-18 MED ORDER — CHLORHEXIDINE GLUCONATE 0.12 % MT SOLN
15.0000 mL | Freq: Two times a day (BID) | OROMUCOSAL | Status: DC
  Administered 2013-08-18 – 2013-08-23 (×12): 15 mL via OROMUCOSAL
  Filled 2013-08-18 (×14): qty 15

## 2013-08-18 MED ORDER — CEFAZOLIN SODIUM-DEXTROSE 2-3 GM-% IV SOLR: 2.0000 g | Freq: Three times a day (TID) | INTRAVENOUS | Status: DC

## 2013-08-18 MED ORDER — POTASSIUM CHLORIDE 20 MEQ/15ML (10%) PO LIQD
40.0000 meq | ORAL | Status: AC
  Administered 2013-08-18 (×2): 40 meq
  Filled 2013-08-18 (×2): qty 30

## 2013-08-18 MED ORDER — MIDAZOLAM HCL 2 MG/2ML IJ SOLN
1.0000 mg | INTRAMUSCULAR | Status: DC | PRN
  Administered 2013-08-18 – 2013-08-19 (×4): 2 mg via INTRAVENOUS
  Filled 2013-08-18 (×4): qty 2

## 2013-08-18 MED ORDER — MIDAZOLAM HCL 2 MG/2ML IJ SOLN
INTRAMUSCULAR | Status: AC
  Filled 2013-08-18: qty 2

## 2013-08-18 MED ORDER — PROPOFOL 10 MG/ML IV EMUL
INTRAVENOUS | Status: AC
  Filled 2013-08-18: qty 100

## 2013-08-18 MED ORDER — INSULIN ASPART 100 UNIT/ML ~~LOC~~ SOLN
2.0000 [IU] | SUBCUTANEOUS | Status: DC
  Administered 2013-08-18: 2 [IU] via SUBCUTANEOUS

## 2013-08-18 MED ORDER — FENTANYL CITRATE 0.05 MG/ML IJ SOLN
100.0000 ug | INTRAMUSCULAR | Status: DC | PRN
  Administered 2013-08-18 (×5): 100 ug via INTRAVENOUS
  Filled 2013-08-18 (×5): qty 2

## 2013-08-18 MED ORDER — ADULT MULTIVITAMIN LIQUID CH
5.0000 mL | Freq: Every day | ORAL | Status: DC
  Administered 2013-08-18: 5 mL
  Filled 2013-08-18 (×2): qty 5

## 2013-08-18 MED ORDER — FAMOTIDINE 40 MG/5ML PO SUSR
20.0000 mg | Freq: Two times a day (BID) | ORAL | Status: DC
  Administered 2013-08-18 (×2): 20 mg
  Filled 2013-08-18 (×4): qty 2.5

## 2013-08-18 MED ORDER — SODIUM BICARBONATE 8.4 % IV SOLN
INTRAVENOUS | Status: DC
  Administered 2013-08-18: 10:00:00 via INTRAVENOUS
  Filled 2013-08-18 (×3): qty 150

## 2013-08-18 MED ORDER — MAGNESIUM SULFATE 40 MG/ML IJ SOLN
2.0000 g | Freq: Once | INTRAMUSCULAR | Status: AC
  Administered 2013-08-18: 2 g via INTRAVENOUS
  Filled 2013-08-18: qty 50

## 2013-08-18 MED ORDER — FREE WATER
200.0000 mL | Freq: Three times a day (TID) | Status: DC
  Administered 2013-08-18 – 2013-08-19 (×2): 200 mL

## 2013-08-18 MED ORDER — VITAL HIGH PROTEIN PO LIQD
1000.0000 mL | ORAL | Status: DC
  Filled 2013-08-18 (×2): qty 1000

## 2013-08-18 MED ORDER — INSULIN ASPART 100 UNIT/ML ~~LOC~~ SOLN
0.0000 [IU] | SUBCUTANEOUS | Status: DC
  Administered 2013-08-18 (×3): 3 [IU] via SUBCUTANEOUS
  Administered 2013-08-18 – 2013-08-19 (×2): 2 [IU] via SUBCUTANEOUS

## 2013-08-18 MED ORDER — SODIUM CHLORIDE 0.9 % IV BOLUS (SEPSIS)
500.0000 mL | Freq: Once | INTRAVENOUS | Status: AC
  Administered 2013-08-18: 500 mL via INTRAVENOUS

## 2013-08-18 MED ORDER — PRO-STAT SUGAR FREE PO LIQD
60.0000 mL | Freq: Three times a day (TID) | ORAL | Status: DC
  Administered 2013-08-18 (×2): 60 mL
  Filled 2013-08-18 (×6): qty 60

## 2013-08-18 MED ORDER — VANCOMYCIN HCL 10 G IV SOLR
1250.0000 mg | INTRAVENOUS | Status: DC
  Administered 2013-08-18: 1250 mg via INTRAVENOUS
  Filled 2013-08-18 (×2): qty 1250

## 2013-08-18 NOTE — Progress Notes (Signed)
RCS  Patient was combative and thrashing about the bed. Both Aline and CVP were pulled apart. I replaced both lines. The Aline had a poor waveform and was difficult to draw blood from. After many attempts to remedy it, the Aline was pulled out and per Dr. Sung AmabileSimonds not replaced.

## 2013-08-18 NOTE — Progress Notes (Signed)
VASCULAR LAB PRELIMINARY  PRELIMINARY  PRELIMINARY  PRELIMINARY  Bilateral lower extremity venous duplex  completed.    Preliminary report:  Bilateral:  No evidence of DVT, superficial thrombosis, or Baker's Cyst.    Gara KronerHelene F Aleina Burgio, RVT 08/18/2013, 11:21 AM

## 2013-08-18 NOTE — Progress Notes (Signed)
ANTIBIOTIC CONSULT NOTE   Pharmacy Consult for Vancomycin Indication: sepsis  Allergies  Allergen Reactions  . Latex     Patient Measurements: Height: 5\' 3"  (160 cm) Weight: 192 lb 0.3 oz (87.1 kg) IBW/kg (Calculated) : 52.4 Adjusted Body Weight: 70 kg  Vital Signs: Temp: 99.1 F (37.3 C) (04/22 0600) Temp src: Core (Comment) (04/22 0100) BP: 104/62 mmHg (04/22 0600) Pulse Rate: 104 (04/22 0600) Intake/Output from previous day: 04/21 0701 - 04/22 0700 In: 4280 [I.V.:2230; IV Piggyback:2050] Out: 2315 [Urine:2315] Intake/Output from this shift: Total I/O In: 4280 [I.V.:2230; IV Piggyback:2050] Out: 2315 [Urine:2315]  Labs:  Recent Labs  08/17/13 1816 08/17/13 2020 08/17/13 2359 08/18/13 0500  WBC 10.1  --  10.3 14.6*  HGB 8.0*  --  7.8* 8.1*  PLT 397  --  379 441*  CREATININE 6.59* 5.27*  --  2.57*   Estimated Creatinine Clearance: 26.2 ml/min (by C-G formula based on Cr of 2.57). No results found for this basename: VANCOTROUGH, Leodis BinetVANCOPEAK, VANCORANDOM, GENTTROUGH, GENTPEAK, GENTRANDOM, TOBRATROUGH, TOBRAPEAK, TOBRARND, AMIKACINPEAK, AMIKACINTROU, AMIKACIN,  in the last 72 hours   Microbiology: Recent Results (from the past 720 hour(s))  CULTURE, BLOOD (ROUTINE X 2)     Status: None   Collection Time    08/17/13  6:16 PM      Result Value Ref Range Status   Specimen Description BLOOD RIGHT HAND   Final   Special Requests BOTTLES DRAWN AEROBIC AND ANAEROBIC 4CC EACH   Final   Culture PENDING   Incomplete   Report Status PENDING   Incomplete  CULTURE, BLOOD (ROUTINE X 2)     Status: None   Collection Time    08/17/13  6:21 PM      Result Value Ref Range Status   Specimen Description BLOOD NECK   Final   Special Requests     Final   Value: BOTTLES DRAWN AEROBIC AND ANAEROBIC AEB 4CC ANA 2CC   Culture PENDING   Incomplete   Report Status PENDING   Incomplete  MRSA PCR SCREENING     Status: None   Collection Time    08/17/13 10:30 PM      Result Value Ref  Range Status   MRSA by PCR NEGATIVE  NEGATIVE Final   Comment:            The GeneXpert MRSA Assay (FDA     approved for NASAL specimens     only), is one component of a     comprehensive MRSA colonization     surveillance program. It is not     intended to diagnose MRSA     infection nor to guide or     monitor treatment for     MRSA infections.   Assessment: 53 yo female with AMS/VDRF, possible sepsis, for empiric antibiotics.    Goal of Therapy:  Vancomycin trough level 15-20 mcg/ml  Plan:  Vancomycin 1250 mg IV q24h F/U renal function  Gary FleetGregory Vernon [place] 08/18/2013,6:14 AM

## 2013-08-18 NOTE — Progress Notes (Signed)
RT made Vent changes per Dr. Kendrick FriesMcQuaid. VT 8cc, rate 16, oxygen 60%.

## 2013-08-18 NOTE — Procedures (Signed)
Arterial Catheter Insertion Procedure Note Merlinda FrederickBrenda M Peaden [id number] [birth year]  Procedure: Insertion of Arterial Catheter  Indications: Blood pressure monitoring and Frequent blood sampling  Procedure Details Consent: Unable to obtain consent because of altered level of consciousness. Time Out: Verified patient identification, verified procedure, site/side was marked, verified correct patient position, special equipment/implants available, medications/allergies/relevent history reviewed, required imaging and test results available.  Performed  Maximum sterile technique was used including antiseptics, cap, gloves, gown, hand hygiene, mask and sheet. Skin prep: Chlorhexidine; local anesthetic administered 22 gauge catheter was inserted into left radial artery using the Seldinger technique.  Evaluation Blood flow good; BP tracing good. Complications: No apparent complications.  Positive allens test, BP 120/70   Loma BostonKasey L Joyce 08/18/2013

## 2013-08-18 NOTE — Progress Notes (Signed)
UR completed.  Laird Runnion, RN BSN [facility] CCM Trauma/Neuro ICU Case Manager [phone]  

## 2013-08-18 NOTE — Progress Notes (Signed)
At 0215 noticed patient's Blood pressure sustains low MAP at 55. Elink Notified. Dr. Darrick Pennaeterding gave order to infuse 500 bolus of NS, D/cd propofol and gave Fentanyl PRN order. Propofol D/cd, Bolus NS given and Fentanyl 100mcg given. At 0245 MAP is 42. Patient is arousable and does follow commands. Notified ELink and told to Bolus another 500 NS. At 0315 Blood pressure is 72/34 , Notified Dr. Darrick Pennaeterding. Ordered to Bolus NS 500ml and start Levophed. See MAR. Will continue to monitor

## 2013-08-18 NOTE — Progress Notes (Signed)
Patient probably 4 weeks status post L4-5 decompression and fusion which was uncomplicated. Patient seen a properly 1 week ago in the office doing well with complaints of incisional pain but no radicular pain and no other issues. Patient found at home confused and disoriented. [facility]. Patient by report was hypotensive in respiratory distress and [facility]. Intubated and transferred to the hospital for further therapy.  At the hospital the patient will awaken and has intermittently follow commands. She still somewhat restless and confused. She's been afebrile. White blood cell count is normal. Electrolytes are okay. On exam she is awake and alert. She is moving all 4 extremities. She follows commands but intermittently is very restless and agitated. Her wound is healing well except for approximately half centimeter area and the superior aspect which has a small amount of serous drainage. There is no evidence of cellulitis or deeper infection. At this represents some very minimal wound he has since with some fat necrosis and drainage. There is no history of CSF leak intraoperatively and I am not concerned this represents any type of CSF fistula present.  I reviewed patient's CT scan of her lumbar spine. This demonstrates no deep wound space collections. Instrumentation and decompression looks good.  Patient readmitted for uncertain cause. Possible narcotic overdose. Continue supportive measures. No acute wound issues for my standpoint. Patient to be placed on prophylactic antibiotics secondary to her wound drainage. I think Ancef would be fine.
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
## 2013-08-18 NOTE — Progress Notes (Addendum)
PULMONARY / CRITICAL CARE MEDICINE   Name: Betty Fuentes MRN: [id number] DOB: 09-03-1960    ADMISSION DATE:  08/17/2013  REFERRING MD :  [facility]  CHIEF COMPLAINT:  Altered mental status  BRIEF PATIENT DESCRIPTION:  53 yo female found unresponsive by friend and brought to [facility].  She was hypotensive and in respiratory distress at [facility].  She was transferred to [facility] for further therapy.  She had recent L4/L5 fusion and taking frequent narcotic pain medications.  SIGNIFICANT EVENTS: 4/22 Admit with acute respiratory failure  STUDIES:  4/21 CT head >> Lt frontal scalp hematoma 4/21 CT lumbar spine >> no focal fluid collections  LINES / TUBES: OETT 4/21>> R IJ CVL 4/21>>  CULTURES: BC 4/21>> Resp 4/21>> UC 4/21>>  ANTIBIOTICS: Vanc 4/21>> Zosyn 4/21>>  SUBJECTIVE:  Remains on pressors.    VITAL SIGNS: Temp:  [98.4 F (36.9 C)-100 F (37.8 C)] 99.5 F (37.5 C) (04/22 0731) Pulse Rate:  [76-145] 98 (04/22 0731) Resp:  [16-33] 22 (04/22 0731) BP: (72-161)/(34-117) 122/71 mmHg (04/22 0731) SpO2:  [66 %-100 %] 93 % (04/22 0731) Arterial Line BP: (109-139)/(53-76) 129/65 mmHg (04/22 0700) FiO2 (%):  [50 %-100 %] 50 % (04/22 0732) Weight:  [189 lb (85.73 kg)-192 lb 0.3 oz (87.1 kg)] 192 lb 0.3 oz (87.1 kg) (04/22 0444)  HEMODYNAMICS: CVP:  [0 mmHg-27 mmHg] 14 mmHg  VENTILATOR SETTINGS: Vent Mode:  [-] PRVC FiO2 (%):  [50 %-100 %] 50 % Set Rate:  [16 bmp-25 bmp] 22 bmp Vt Set:  [420 mL-550 mL] 550 mL PEEP:  [5 cmH20] 5 cmH20 Plateau Pressure:  [14 cmH20-22 cmH20] 21 cmH20  INTAKE / OUTPUT: Intake/Output     04/21 0701 - 04/22 0700 04/22 0701 - 04/23 0700   I.V. (mL/kg) 2230 (25.6)    IV Piggyback 2050    Total Intake(mL/kg) 4280 (49.1)    Urine (mL/kg/hr) 2315    Total Output 2315     Net +1965           PHYSICAL EXAMINATION: General: no distress   Neuro: RASS -3, followed commands on WUA HEENT: Lt scalp hematoma, pupils reactive, ETT in  place Cardiovascular: regular, no murmur Lungs: scattered rhonchi Abdomen: soft, non tender, + bowel sounds Musculoskeletal: no edema Skin: no rashes Back: lumbar spine wound partially open superficially with clear fluid drainage  LABS:  CBC  Recent Labs Lab 08/17/13 1816 08/17/13 2359 08/18/13 0500  WBC 10.1 10.3 14.6*  HGB 8.0* 7.8* 8.1*  HCT 24.6* 24.2* 25.8*  PLT 397 379 441*   Coag's  Recent Labs Lab 08/17/13 1816  INR 1.14   BMET  Recent Labs Lab 08/17/13 1816 08/17/13 2020 08/18/13 0500  NA 138 139 145  K 6.0* 5.0 4.3  CL 100 104 111  CO2 19 16* 17*  BUN 66* 59* 46*  CREATININE 6.59* 5.27* 2.57*  GLUCOSE 130* 107* 117*   Electrolytes  Recent Labs Lab 08/17/13 1816 08/17/13 2020 08/17/13 2359 08/18/13 0500  CALCIUM 9.0 8.2*  --  8.1*  MG  --   --  1.3* 2.1  PHOS  --   --  3.7 4.3   Sepsis Markers  Recent Labs Lab 08/17/13 1816 08/17/13 2310 08/18/13 0500  LATICACIDVEN  --  1.1  --   PROCALCITON 2.24  --  0.80   ABG  Recent Labs Lab 08/17/13 2303 08/18/13 0440 08/18/13 0630  PHART 7.364 7.243* 7.378  PCO2ART 28.8* 41.8 27.7*  PO2ART 424.0* 157.0* 149.0*  Liver Enzymes  Recent Labs Lab 08/17/13 1816  AST 34  ALT 15  ALKPHOS 108  BILITOT 0.2*  ALBUMIN 3.0*   Cardiac Enzymes  Recent Labs Lab 08/17/13 2359  TROPONINI <0.30   Glucose  Recent Labs Lab 08/17/13 1751 08/17/13 1825 08/18/13 0050  GLUCAP 132* 131* 123*    Imaging Ct Head Wo Contrast  08/17/2013   CLINICAL DATA:  Altered mental status  EXAM: CT HEAD WITHOUT CONTRAST  TECHNIQUE: Contiguous axial images were obtained from the base of the skull through the vertex without intravenous contrast. Study was obtained within 24 hr of patient's arrival at the emergency department.  COMPARISON:  Brain CT and brain MRI July 23, 2013  FINDINGS: The ventricles are normal in size and configuration. There is no mass, hemorrhage, extra-axial fluid collection, or  midline shift. Gray-white compartments appear normal. There is no demonstrable acute infarct. There is a small left frontal scalp hematoma. Bony calvarium appears intact. The mastoid air cells are clear.  IMPRESSION: Small left frontal scalp hematoma. Bony calvarium appears intact. No intracranial mass, hemorrhage, or acute appearing infarct.   Electronically Signed   By: Bretta BangWilliam  Woodruff M.D.   On: 08/17/2013 21:39   Ct Lumbar Spine Wo Contrast  08/17/2013   CLINICAL DATA:  Patient found down.  EXAM: CT LUMBAR SPINE WITHOUT CONTRAST  TECHNIQUE: Multidetector CT imaging of the lumbar spine was performed without intravenous contrast administration. Multiplanar CT image reconstructions were also generated.  COMPARISON:  MR lumbar spine 05/27/2013.  FINDINGS: Since the prior MRI, the patient has undergone L4-5 fusion. Pedicle screws and stabilization bars are in place with an interbody spacer identified. Hardware is intact and well-positioned. There is some incorporation of interbody spacer into the endplates of L4 and L5 and coalescence of graft material about the posterior elements. 0.5 cm anterolisthesis L4 on L5 seen on the prior MRI has been nearly completely reduced with only trace anterolisthesis identified on today's examination. No fracture is identified. There is a small amount of air in subcutaneous tissues along the midline wound. No focal fluid collection is identified. The central spinal canal appears widely patent at all levels. Neural foramina appear open. Small left pleural effusion is noted. NG tube is in place.  IMPRESSION: Status post L4-5 fusion. There is some gas in the subcutaneous soft tissues of the back presumably related to surgery. No focal fluid collection is identified. Stabilization hardware is intact without evidence of loosening or other complicating feature.  Negative for fracture.  Small left pleural effusion.   Electronically Signed   By: Drusilla Kannerhomas  Dalessio M.D.   On: 08/17/2013  21:47   Dg Chest Portable 1 View  08/17/2013   CLINICAL DATA:  Drug overdose; hypoxia  EXAM: PORTABLE CHEST - 1 VIEW  COMPARISON:  July 08, 2013  FINDINGS: Endotracheal tube tip is in the right main bronchus. Central catheter tip is in the superior vena cava. No pneumothorax. There is significant left lower lobe collapse. Right lung is hyperexpanded but clear. Heart size and pulmonary vascularity are normal.  IMPRESSION: Endotracheal tube tip in right main bronchus. Advise withdrawing endotracheal tube approximately 7 cm. Partial collapse of the left lung. No pneumothorax.  Critical Value/emergent results were called by telephone at the time of interpretation on 08/17/2013 at 8:20 PM to Dr. Glynn OctaveSTEPHEN RANCOUR , who verbally acknowledged these results.   Electronically Signed   By: Bretta BangWilliam  Woodruff M.D.   On: 08/17/2013 20:21   Dg Chest Port 1v Same Day  08/17/2013   CLINICAL DATA:  Drug overdose; hypoxia  EXAM: PORTABLE CHEST - 1 VIEW SAME DAY  COMPARISON:  None.  FINDINGS: Endotracheal tube tip is 3.4 cm above the carina. Central catheter tip is in the superior vena cava. Nasogastric tube tip and side port are in the stomach. No pneumothorax. There is consolidation in the left lower lobe. Lungs are otherwise clear. Heart size and pulmonary vascularity are normal.  IMPRESSION: Tube and catheter positions as described without appreciable pneumothorax. Left lower lobe consolidation present.   Electronically Signed   By: Bretta BangWilliam  Woodruff M.D.   On: 08/17/2013 21:02   ASSESSMENT/PLAN:  NEUROLOGIC A:  Acute encephalopathy 2nd to renal/respiratory failure >> most likely from accidental opiate overdose. Recent lumbar spine surgery. Hx of depression, anxiety. P:   Sedation protocol while on vent >> goal RASS -1 to -2 until more stable Hold home meds of valium, elavil, klonopin, trazodone, norco, flexeril for now Consult Dr. Jordan LikesPool to assess lumbar spine surgical wound  PULMONARY A: Acute respiratory  failure 2nd to altered mental status. P:   Full vent support until more stable F/u CXR, ABG  CARDIOVASCULAR A:  Shock >> most likely from hypovolemia, less likely sepsis. Hx of HTN with chronic diastolic dysfx, hyperlipidemia. P:  Continue volume resuscitation  Wean pressors to keep MAP > 65, SBP > 90 Hold outpt ASA, HCTZ, lopressor, pravachol for now  RENAL A:   AKI 2nd to volume depletion. Metabolic acidosis >> from hypovolemia, renal failure, and metformin use. Hypomagnesemia. Hyperkalemia >> from acidosis >> resolved. P:   Change IV fluid to add HCO3 F/u BMET, ABG Monitor renal fx, urine outpt  GASTROINTESTINAL A:   Nutrition. Hx of GERD. P:   Tube feeds while on vent pepcid for SUP  HEMATOLOGIC A:   Anemia. P:  Check iron studies SQ heparin for DVT prophylaxis F/u CBC Transfuse for Hb < 7  INFECTIOUS A:   ?sepsis, ? Aspiration PNA >> seems less likely P:   Day 2 vancomycin, zosyn >> d/c ABx soon if cx results negative  ENDOCRINE A:   Hx of DM type II. P:   SSI Hold outpt glucotrol, glucophage   CC time 40 minutes.  Coralyn HellingVineet Jamaris Theard, MD [facility] 08/18/2013, 8:04 AM Pager:  [phone] After 3pm call: [phone]

## 2013-08-18 NOTE — Progress Notes (Signed)
eLink Physician-Brief Progress Note Patient Name: Betty FrederickBrenda M Fuentes DOB: 10/10/1960 MRN: [id number]  Date of Service  08/18/2013   HPI/Events of Note  BMET    Component Value Date/Time   NA 146 08/18/2013 1600   K 2.7* 08/18/2013 1600   CL 114* 08/18/2013 1600   CO2 18* 08/18/2013 1600   GLUCOSE 148* 08/18/2013 1600   BUN 22 08/18/2013 1600   CREATININE 0.89 08/18/2013 1600   CALCIUM 6.3* 08/18/2013 1600   GFRNONAA 72* 08/18/2013 1600   GFRAA 84* 08/18/2013 1600      eICU Interventions  DC HCO3 gtt Replete K+ Replete free water   Intervention Category Major Interventions: Acid-Base disturbance - evaluation and management;Electrolyte abnormality - evaluation and management  Merwyn KatosDavid B Heraclio Seidman 08/18/2013, 5:52 PM

## 2013-08-18 NOTE — Progress Notes (Signed)
RT changed Vent settings  per Dr. Darrick Pennaeterding. VT 550, Rate 22. RT will draw an ABG one hour after changes.

## 2013-08-18 NOTE — Progress Notes (Signed)
CCM Interim Progress Note.  Called patient's emergency contact - Betty Fuentes [phone]) - Betty Fuentes is a friend of the patient - and has been helping her for the last 2.5 years. He feels that she has been getting to o much pain medication over the last 2 years, and is not taking medication as prescribed.  To note, she is getting pain medications from several providers (Dr. Gerilyn Pilgr[place]oonquah in [place] (neurologist - pain mgmt), from [place]CP in [place], Dr. Dutch QuintPoole s/p L4-5 decompression & fusion on 07/19/13).     Betty Fuentes last saw patient on Sunday, she came to wash clothes, she was out of it - concern she was taking too much pain medication at that time as well.  She also had a fall recently, thought to be secondary to pain meds and Betty Fuentes called the rescue sqaud in [place]  Betty Fuentes expressed great concern regarding her multiple pain med providers- I explained that our goal is to help her safely recover during this hospitalization, and plan for a safe discharge, which may include contacting all of her pain med providers.     Betty GardnerNeema Anaston Koehn, MD (IMTS, PGY3)

## 2013-08-18 NOTE — Progress Notes (Signed)
CRITICAL VALUE ALERT  Critical value received:  Potassium 2.7 and Calcium 6.3  Date of notification:  08/18/2013  Time of notification:  17:45  Critical value read back:yes  Nurse who received alert:  B. Marjo Bickerulbertson, RN  MD notified (1st page):  Dr. Sung AmabileSimonds  Time of first page:  1749  MD notified (2nd page):   Time of second page:  Responding MD:  Dr. Sung AmabileSimonds  Time MD responded:  [phone]  Order received for

## 2013-08-19 ENCOUNTER — Inpatient Hospital Stay (HOSPITAL_COMMUNITY): Payer: Medicaid Other

## 2013-08-19 DIAGNOSIS — F411 Generalized anxiety disorder: Secondary | ICD-10-CM

## 2013-08-19 DIAGNOSIS — D649 Anemia, unspecified: Secondary | ICD-10-CM

## 2013-08-19 LAB — OCCULT BLOOD X 1 CARD TO LAB, STOOL
Fecal Occult Bld: NEGATIVE
Fecal Occult Bld: NEGATIVE
Fecal Occult Bld: NEGATIVE

## 2013-08-19 LAB — GLUCOSE, CAPILLARY
GLUCOSE-CAPILLARY: 114 mg/dL — AB (ref 70–99)
Glucose-Capillary: 104 mg/dL — ABNORMAL HIGH (ref 70–99)
Glucose-Capillary: 152 mg/dL — ABNORMAL HIGH (ref 70–99)
Glucose-Capillary: 99 mg/dL (ref 70–99)
Glucose-Capillary: 92 mg/dL (ref 70–99)

## 2013-08-19 LAB — PROCALCITONIN: Procalcitonin: 0.26 ng/mL

## 2013-08-19 LAB — BASIC METABOLIC PANEL
BUN: 28 mg/dL — AB (ref 6–23)
CHLORIDE: 107 meq/L (ref 96–112)
CO2: 25 mEq/L (ref 19–32)
Calcium: 8.7 mg/dL (ref 8.4–10.5)
Creatinine, Ser: 0.76 mg/dL (ref 0.50–1.10)
GFR calc Af Amer: >90 mL/min (ref >90)
GLUCOSE: 148 mg/dL — AB (ref 70–99)
Potassium: 3.9 mEq/L (ref 3.7–5.3)
Sodium: 144 mEq/L (ref 137–147)

## 2013-08-19 LAB — URINE CULTURE
COLONY COUNT: NO GROWTH
CULTURE: NO GROWTH

## 2013-08-19 LAB — POCT I-STAT 3, VENOUS BLOOD GAS (G3P V)
Acid-Base Excess: 3 mmol/L — ABNORMAL HIGH (ref 0.0–2.0)
Bicarbonate: 25.3 mEq/L — ABNORMAL HIGH (ref 20.0–24.0)
O2 Saturation: 80 %
PCO2 VEN: 30.6 mmHg — AB (ref 45.0–50.0)
PH VEN: 7.525 — AB (ref 7.250–7.300)
TCO2: 26 mmol/L (ref 0–100)
pO2, Ven: 38 mmHg (ref 30.0–45.0)

## 2013-08-19 LAB — CBC
HEMATOCRIT: 22.9 % — AB (ref 36.0–46.0)
Hemoglobin: 7.2 g/dL — ABNORMAL LOW (ref 12.0–15.0)
MCH: 26.7 pg (ref 26.0–34.0)
MCHC: 31.4 g/dL (ref 30.0–36.0)
MCV: 84.8 fL (ref 78.0–100.0)
Platelets: 384 10*3/uL (ref 150–400)
RBC: 2.7 MIL/uL — ABNORMAL LOW (ref 3.87–5.11)
RDW: 14.1 % (ref 11.5–15.5)
WBC: 7.6 10*3/uL (ref 4.0–10.5)

## 2013-08-19 LAB — PHOSPHORUS: PHOSPHORUS: 1.4 mg/dL — AB (ref 2.3–4.6)

## 2013-08-19 LAB — MAGNESIUM: Magnesium: 1 mg/dL — ABNORMAL LOW (ref 1.5–2.5)

## 2013-08-19 MED ORDER — HALOPERIDOL LACTATE 5 MG/ML IJ SOLN
1.0000 mg | INTRAMUSCULAR | Status: DC | PRN
  Administered 2013-08-19: 2 mg via INTRAVENOUS
  Filled 2013-08-19: qty 1

## 2013-08-19 MED ORDER — POTASSIUM PHOSPHATE DIBASIC 3 MMOLE/ML IV SOLN
20.0000 mmol | Freq: Once | INTRAVENOUS | Status: AC
  Administered 2013-08-19: 20 mmol via INTRAVENOUS
  Filled 2013-08-19: qty 6.67

## 2013-08-19 MED ORDER — INSULIN ASPART 100 UNIT/ML ~~LOC~~ SOLN
0.0000 [IU] | Freq: Three times a day (TID) | SUBCUTANEOUS | Status: DC
  Administered 2013-08-19: 3 [IU] via SUBCUTANEOUS
  Administered 2013-08-20 – 2013-08-21 (×2): 2 [IU] via SUBCUTANEOUS

## 2013-08-19 MED ORDER — PIPERACILLIN-TAZOBACTAM 3.375 G IVPB
3.3750 g | Freq: Three times a day (TID) | INTRAVENOUS | Status: DC
Start: 2013-08-19 — End: 2013-08-20
  Administered 2013-08-19 – 2013-08-20 (×2): 3.375 g via INTRAVENOUS
  Filled 2013-08-19 (×4): qty 50

## 2013-08-19 MED ORDER — FENTANYL CITRATE 0.05 MG/ML IJ SOLN: 50.0000 ug | INTRAMUSCULAR | Status: DC | PRN

## 2013-08-19 MED ORDER — SODIUM CHLORIDE 0.9 % IV SOLN
INTRAVENOUS | Status: DC
  Administered 2013-08-19 (×2): via INTRAVENOUS

## 2013-08-19 MED ORDER — LORAZEPAM 2 MG/ML IJ SOLN: 1.0000 mg | INTRAMUSCULAR | Status: DC | PRN

## 2013-08-19 MED ORDER — MAGNESIUM SULFATE IN D5W 10-5 MG/ML-% IV SOLN
1.0000 g | Freq: Once | INTRAVENOUS | Status: AC
  Administered 2013-08-19: 1 g via INTRAVENOUS
  Filled 2013-08-19: qty 100

## 2013-08-19 MED ORDER — FAMOTIDINE IN NACL 20-0.9 MG/50ML-% IV SOLN
20.0000 mg | Freq: Two times a day (BID) | INTRAVENOUS | Status: DC
  Administered 2013-08-19 (×2): 20 mg via INTRAVENOUS
  Filled 2013-08-19 (×4): qty 50

## 2013-08-19 NOTE — Progress Notes (Signed)
ANTIBIOTIC CONSULT NOTE - FOLLOW UP  Pharmacy Consult for zosyn Indication: ? sepsis  Allergies  Allergen Reactions  . Latex Other (See Comments)    Unknown  . Yeast-Related Products Other (See Comments)    Unknown    Patient Measurements: Height: 5\' 3"  (160 cm) Weight: 189 lb 2.5 oz (85.8 kg) IBW/kg (Calculated) : 52.4   Vital Signs: Temp: 98.4 F (36.9 C) (04/23 1400) Temp src: Core (Comment) (04/23 0400) BP: 108/93 mmHg (04/23 1400) Pulse Rate: 102 (04/23 1345) Intake/Output from previous day: 04/22 0701 - 04/23 0700 In: 3382.4 [I.V.:2132.4; NG/GT:1100; IV Piggyback:150] Out: 4150 [Urine:4150] Intake/Output from this shift: Total I/O In: 846.3 [I.V.:276.3; IV Piggyback:570] Out: 615 [Urine:615]  Labs:  Recent Labs  08/17/13 2359 08/18/13 0500 08/18/13 1600 08/19/13 0550  WBC 10.3 14.6*  --  7.6  HGB 7.8* 8.1*  --  7.2*  PLT 379 441*  --  384  CREATININE  --  2.57* 0.89 0.76   Estimated Creatinine Clearance: 83.5 ml/min (by C-G formula based on Cr of 0.76). No results found for this basename: VANCOTROUGH, Leodis BinetVANCOPEAK, VANCORANDOM, GENTTROUGH, GENTPEAK, GENTRANDOM, TOBRATROUGH, TOBRAPEAK, TOBRARND, AMIKACINPEAK, AMIKACINTROU, AMIKACIN,  in the last 72 hours   Microbiology: Recent Results (from the past 720 hour(s))  CULTURE, BLOOD (ROUTINE X 2)     Status: None   Collection Time    08/17/13  6:16 PM      Result Value Ref Range Status   Specimen Description BLOOD RIGHT HAND   Final   Special Requests BOTTLES DRAWN AEROBIC AND ANAEROBIC 4CC EACH   Final   Culture NO GROWTH 2 DAYS   Final   Report Status PENDING   Incomplete  CULTURE, BLOOD (ROUTINE X 2)     Status: None   Collection Time    08/17/13  6:21 PM      Result Value Ref Range Status   Specimen Description BLOOD NECK VEIN COLLECTED BY DOCTOR   Final   Special Requests     Final   Value: BOTTLES DRAWN AEROBIC AND ANAEROBIC AEB=4CC ANA=2CC   Culture NO GROWTH 2 DAYS   Final   Report Status  PENDING   Incomplete  URINE CULTURE     Status: None   Collection Time    08/17/13  6:23 PM      Result Value Ref Range Status   Specimen Description URINE, CATHETERIZED   Final   Special Requests NONE   Final   Culture  Setup Time     Final   Value: 08/17/2013 21:00     [facility]formed at [facility]   Colony Count     Final   Value: NO GROWTH     Performed at [facility]   Culture     Final   Value: NO GROWTH     Performed at [facility]   Report Status 08/19/2013 FINAL   Final  MRSA PCR SCREENING     Status: None   Collection Time    08/17/13 10:30 PM      Result Value Ref Range Status   MRSA by PCR NEGATIVE  NEGATIVE Final   Comment:            The GeneXpert MRSA Assay (FDA     approved for NASAL specimens     only), is one component of a     comprehensive MRSA colonization     surveillance program. It is not     intended to diagnose MRSA  infection nor to guide or     monitor treatment for     MRSA infections.  CULTURE, RESPIRATORY (NON-EXPECTORATED)     Status: None   Collection Time    08/18/13 12:05 PM      Result Value Ref Range Status   Specimen Description TRACHEAL ASPIRATE   Final   Special Requests NONE   Final   Gram Stain     Final   Value: ABUNDANT WBC PRESENT, PREDOMINANTLY PMN     RARE SQUAMOUS EPITHELIAL CELLS PRESENT     NO ORGANISMS SEEN     Performed at [facility]   Culture     Final   Value: NO GROWTH 1 DAY     Performed at [facility]   Report Status PENDING   Incomplete    Anti-infectives   Start     Dose/Rate Route Frequency Ordered Stop   08/18/13 1400  ceFAZolin (ANCEF) IVPB 2 g/50 mL premix  Status:  Discontinued     2 g 100 mL/hr over 30 Minutes Intravenous 3 times per day 08/18/13 0950 08/18/13 1007   08/18/13 0800  vancomycin (VANCOCIN) 1,250 mg in sodium chloride 0.9 % 250 mL IVPB  Status:  Discontinued     1,250 mg 166.7 mL/hr over 90 Minutes Intravenous Every 24 hours 08/18/13 0616  08/19/13 0810   08/17/13 2100  piperacillin-tazobactam (ZOSYN) IVPB 2.25 g     2.25 g 100 mL/hr over 30 Minutes Intravenous Every 8 hours 08/17/13 2037     08/17/13 2045  vancomycin (VANCOCIN) IVPB 1000 mg/200 mL premix     1,000 mg 200 mL/hr over 60 Minutes Intravenous  Once 08/17/13 2038 08/17/13 2228   08/17/13 1830  piperacillin-tazobactam (ZOSYN) IVPB 3.375 g  Status:  Discontinued     3.375 g 100 mL/hr over 30 Minutes Intravenous  Once 08/17/13 1815 08/17/13 2129   08/17/13 1830  vancomycin (VANCOCIN) IVPB 1000 mg/200 mL premix  Status:  Discontinued     1,000 mg 200 mL/hr over 60 Minutes Intravenous  Once 08/17/13 1815 08/17/13 2039      Assessment: 53 yo female with possible sepsis on zosyn. WBC= 7.7, SCr= 0.76 (down from 2.57 on 4/22) and CrCl ~ 80.   vanc 4/22>> 4/23 Zosyn 4/22  4/21 BC X 2>> ngtd 4.21 UC>> neg 4/22 resp- ngtd  Plan: -Change zosyn to 3.375gm IV q8h -Will follow renal function, cultures and clinical progress  Harland Germanndrew Neshia Mckenzie, Pharm D 08/19/2013 2:18 PM

## 2013-08-19 NOTE — Progress Notes (Signed)
Fentanyl 210 cc of 5310mcg/ml concentration IV bag wasted in sink. Zenovia JordanSydney Sharpe, RN as witness.   Holly BodilyBethany Leigh Culbertson

## 2013-08-19 NOTE — Progress Notes (Signed)
Patient extubated this morning. Complains that she hurts all over. Denies any Pain. No complaints of numbness or weakness.  Awake and aware. Still somewhat confused. Straight-leg raising negative bilaterally. Minimal lumbar tenderness area and chest and abdomen benign.  Overall stable. Begin mobilization. Continue to observe. Source of a vent still unclear little likely narcotic overdose.

## 2013-08-19 NOTE — Progress Notes (Signed)
RT attempted to draw arterial blood gas twice with RN assisting to hold patient.  RT got a small amount of blood but once it was ran on istat it was determined to be venous blood.  [facility] and MD said it was ok to change order to venous blood gas and transmit the current results.

## 2013-08-19 NOTE — Progress Notes (Signed)
NUTRITION FOLLOW-UP  INTERVENTION:  1. Encourage PO intake  2. Supplement diet as appropriate  NUTRITION DIAGNOSIS: Inadequate oral intake related to inability to eat as evidenced by NPO status; progressing.   Goal: Pt to meet >/= 90% of their estimated nutrition needs   Monitor:  PO intake, weight trend, labs    ASSESSMENT: [facility], required intubation. Pt is s/p L4-5 decompression and fusion 4 weeks ago, which was uncomplicated. No signs of incision infection at this time.   Pt extubated 4/23 am. Diet advanced, no meals yet. Pt confused. Magnesium and Phosphorus low and being provided.  Pt discussed during ICU rounds and with RN.   Height: Ht Readings from Last 1 Encounters:  08/18/13 5\' 3"  (1.6 m)    Weight: Wt Readings from Last 1 Encounters:  08/19/13 189 lb 2.5 oz (85.8 kg)  Admission weight 189 lb (85.7 kg)  BMI:  Body mass index is 33.52 kg/(m^2).  Estimated Nutritional Needs: Kcal: 1700-1900 Protein: 80-90 grams Fluid: > 1.7 L/day  Skin: lower back incision   Diet Order: Carb Control   Intake/Output Summary (Last 24 hours) at 08/19/13 1326 Last data filed at 08/19/13 1200  Gross per 24 hour  Intake 3030.49 ml  Output   2690 ml  Net 340.49 ml    Last BM: PTA   Labs:   Recent Labs Lab 08/17/13 2359 08/18/13 0500 08/18/13 1600 08/19/13 0550  NA  --  145 146 144  K  --  4.3 2.7* 3.9  CL  --  111 114* 107  CO2  --  17* 18* 25  BUN  --  46* 22 28*  CREATININE  --  2.57* 0.89 0.76  CALCIUM  --  8.1* 6.3* 8.7  MG 1.3* 2.1  --  1.0*  PHOS 3.7 4.3  --  1.4*  GLUCOSE  --  117* 148* 148*    CBG (last 3)   Recent Labs  08/19/13 0320 08/19/13 0815 08/19/13 1226  GLUCAP 114* 104* 152*   Lab Results  Component Value Date   HGBA1C 10.4* 07/23/2013   Scheduled Meds: . antiseptic oral rinse  15 mL Mouth Rinse QID  . chlorhexidine  15 mL Mouth Rinse BID  . famotidine (PEPCID) IV  20 mg Intravenous  Q12H  . heparin  5,000 Units Subcutaneous 3 times per day  . insulin aspart  0-15 Units Subcutaneous TID WC  . piperacillin-tazobactam (ZOSYN)  IV  2.25 g Intravenous Q8H  . potassium phosphate IVPB (mmol)  20 mmol Intravenous Once    Continuous Infusions: . sodium chloride 50 mL/hr at 08/19/13 0824  . norepinephrine (LEVOPHED) Adult infusion Stopped (08/19/13 1200)    Kendell BaneHeather Kemyah Buser RD, LDN, CNSC [phone] Pager [phone] After Hours Pager

## 2013-08-19 NOTE — Progress Notes (Signed)
Patient instructed on the Incentive Spirometer achieving 1250 mL five times. Patient had hard time understanding to take a deep breath vs blowing hard. Will continue to work with patient q4.

## 2013-08-19 NOTE — Procedures (Signed)
Extubation Procedure Note  Patient Details:   Name: Betty FrederickBrenda M Fichter DOB: March 03, 1961 MRN: [id number]   Airway Documentation:     Evaluation  O2 sats: stable throughout Complications: No apparent complications Patient did tolerate procedure well. Bilateral Breath Sounds: Clear Suctioning: Airway Yes Patient tolerated wean. MD ordered to extubate after 15 minutes of wean. Patient very anxious. No cuff leak heard, MD aware and ordered to proceed with extubation. Patient extubated to a 3 Lpm nasal cannula. No signs of dyspnea or stridor noted. Will continue to monitor.  Loel DubonnetCarrie W Ali Mclaurin 08/19/2013, 8:17 AM

## 2013-08-19 NOTE — Progress Notes (Signed)
PULMONARY / CRITICAL CARE MEDICINE   Name: Betty Fuentes MRN: [id number] DOB: 1960/10/19    ADMISSION DATE:  08/17/2013  REFERRING MD :  [facility]  CHIEF COMPLAINT:  Altered mental status  BRIEF PATIENT DESCRIPTION:  53 yo female found unresponsive by friend and brought to [facility].  She was hypotensive and in respiratory distress at [facility].  She was transferred to [facility] for further therapy.  She had recent L4/L5 fusion and taking frequent narcotic pain medications.  SIGNIFICANT EVENTS: 4/22 Admit with acute respiratory failure, neurosurgery consulted 4/23 d/c HCO3 gtt  STUDIES:  4/21 CT head >> Lt frontal scalp hematoma 4/21 CT lumbar spine >> no focal fluid collections  LINES / TUBES: OETT 4/21>> 4/23 R IJ CVL 4/21>>  CULTURES: BC 4/21>> Resp 4/21>> UC 4/21>> negative  ANTIBIOTICS: Vanc 4/21>> 4/23 Zosyn 4/21>>  SUBJECTIVE:  Difficulty with sedation overnight.  Remains on low dose pressors.  Tolerating pressure support.  VITAL SIGNS: Temp:  [97.9 F (36.6 C)-100.2 F (37.9 C)] 98.8 F (37.1 C) (04/23 0700) Pulse Rate:  [75-131] 75 (04/23 0700) Resp:  [8-36] 15 (04/23 0700) BP: (59-165)/(44-148) 101/53 mmHg (04/23 0700) SpO2:  [95 %-100 %] 99 % (04/23 0700) Arterial Line BP: (102-147)/(54-85) 115/84 mmHg (04/22 1600) FiO2 (%):  [30 %-40 %] 30 % (04/23 0400) Weight:  [189 lb 2.5 oz (85.8 kg)] 189 lb 2.5 oz (85.8 kg) (04/23 0500)  HEMODYNAMICS: CVP:  [13 mmHg-18 mmHg] 18 mmHg  VENTILATOR SETTINGS: Vent Mode:  [-] PRVC FiO2 (%):  [30 %-40 %] 30 % Set Rate:  [14 bmp] 14 bmp Vt Set:  [420 mL] 420 mL PEEP:  [5 cmH20] 5 cmH20 Plateau Pressure:  [16 cmH20-20 cmH20] 18 cmH20  INTAKE / OUTPUT: Intake/Output     04/22 0701 - 04/23 0700 04/23 0701 - 04/24 0700   I.V. (mL/kg) 2132.4 (24.9)    NG/GT 1100    IV Piggyback 150    Total Intake(mL/kg) 3382.4 (39.4)    Urine (mL/kg/hr) 4150 (2)    Total Output 4150     Net -767.6           PHYSICAL  EXAMINATION: General: no distress   Neuro: RASS -1, followed commands on WUA HEENT: Lt scalp hematoma, pupils reactive, ETT in place Cardiovascular: regular, no murmur Lungs: scattered rhonchi Abdomen: soft, non tender, + bowel sounds Musculoskeletal: no edema Skin: no rashes Back: lumbar spine wound partially open superficially with clear fluid drainage  LABS:  CBC  Recent Labs Lab 08/17/13 2359 08/18/13 0500 08/19/13 0550  WBC 10.3 14.6* 7.6  HGB 7.8* 8.1* 7.2*  HCT 24.2* 25.8* 22.9*  PLT 379 441* 384   Coag's  Recent Labs Lab 08/17/13 1816  INR 1.14   BMET  Recent Labs Lab 08/18/13 0500 08/18/13 1600 08/19/13 0550  NA 145 146 144  K 4.3 2.7* 3.9  CL 111 114* 107  CO2 17* 18* 25  BUN 46* 22 28*  CREATININE 2.57* 0.89 0.76  GLUCOSE 117* 148* 148*   Electrolytes  Recent Labs Lab 08/17/13 2359 08/18/13 0500 08/18/13 1600 08/19/13 0550  CALCIUM  --  8.1* 6.3* 8.7  MG 1.3* 2.1  --  1.0*  PHOS 3.7 4.3  --  1.4*   Sepsis Markers  Recent Labs Lab 08/17/13 1816 08/17/13 2310 08/18/13 0500 08/19/13 0550  LATICACIDVEN  --  1.1  --   --   PROCALCITON 2.24  --  0.80 0.26   ABG  Recent Labs Lab 08/18/13  0440 08/18/13 0630 08/18/13 1657  PHART 7.243* 7.378 7.357  PCO2ART 41.8 27.7* 47.9*  PO2ART 157.0* 149.0* 142.0*   Liver Enzymes  Recent Labs Lab 08/17/13 1816  AST 34  ALT 15  ALKPHOS 108  BILITOT 0.2*  ALBUMIN 3.0*   Cardiac Enzymes  Recent Labs Lab 08/17/13 2359 08/18/13 0925 08/18/13 1148  TROPONINI <0.30 <0.30 <0.30   Glucose  Recent Labs Lab 08/18/13 0807 08/18/13 1213 08/18/13 1542 08/18/13 1943 08/18/13 2321 08/19/13 0320  GLUCAP 170* 158* 169* 123* 142* 114*    Iron/TIBC/Ferritin    Component Value Date/Time   IRON 16* 08/18/2013 0925   TIBC 190* 08/18/2013 0925   FERRITIN 260 08/18/2013 0925    Imaging Ct Head Wo Contrast  08/17/2013   CLINICAL DATA:  Altered mental status  EXAM: CT HEAD WITHOUT  CONTRAST  TECHNIQUE: Contiguous axial images were obtained from the base of the skull through the vertex without intravenous contrast. Study was obtained within 24 hr of patient's arrival at the emergency department.  COMPARISON:  Brain CT and brain MRI July 23, 2013  FINDINGS: The ventricles are normal in size and configuration. There is no mass, hemorrhage, extra-axial fluid collection, or midline shift. Gray-white compartments appear normal. There is no demonstrable acute infarct. There is a small left frontal scalp hematoma. Bony calvarium appears intact. The mastoid air cells are clear.  IMPRESSION: Small left frontal scalp hematoma. Bony calvarium appears intact. No intracranial mass, hemorrhage, or acute appearing infarct.   Electronically Signed   By: Bretta Bang M.D.   On: 08/17/2013 21:39   Ct Lumbar Spine Wo Contrast  08/17/2013   CLINICAL DATA:  Patient found down.  EXAM: CT LUMBAR SPINE WITHOUT CONTRAST  TECHNIQUE: Multidetector CT imaging of the lumbar spine was performed without intravenous contrast administration. Multiplanar CT image reconstructions were also generated.  COMPARISON:  MR lumbar spine 05/27/2013.  FINDINGS: Since the prior MRI, the patient has undergone L4-5 fusion. Pedicle screws and stabilization bars are in place with an interbody spacer identified. Hardware is intact and well-positioned. There is some incorporation of interbody spacer into the endplates of L4 and L5 and coalescence of graft material about the posterior elements. 0.5 cm anterolisthesis L4 on L5 seen on the prior MRI has been nearly completely reduced with only trace anterolisthesis identified on today's examination. No fracture is identified. There is a small amount of air in subcutaneous tissues along the midline wound. No focal fluid collection is identified. The central spinal canal appears widely patent at all levels. Neural foramina appear open. Small left pleural effusion is noted. NG tube is in  place.  IMPRESSION: Status post L4-5 fusion. There is some gas in the subcutaneous soft tissues of the back presumably related to surgery. No focal fluid collection is identified. Stabilization hardware is intact without evidence of loosening or other complicating feature.  Negative for fracture.  Small left pleural effusion.   Electronically Signed   By: Drusilla Kanner M.D.   On: 08/17/2013 21:47   Dg Chest Port 1 View  08/18/2013   CLINICAL DATA:  ETT, LLL pna?  EXAM: PORTABLE CHEST - 1 VIEW  COMPARISON:  DG CHEST PORT 1VSAME DAY dated 08/17/2013  FINDINGS: Low lung volumes. An endotracheal tube is appreciated with tip 3.1 cm above the carina. A right internal jugular catheter with tip at the level superior vena cava. Cardiac silhouette is enlarged. The patient is rotated to the left. There is slight improved aeration left lung base. There  is also discoid atelectasis versus scarring left lung base. Residual consolidative density is appreciated. Vague linear density right lung base. No acute osseous abnormalities.  IMPRESSION: Support lines and tubes adequately positioned.  Improved aeration left lung base with residual consolidative density.  Areas of atelectasis right and left lung bases.   Electronically Signed   By: Salome HolmesHector  Cooper M.D.   On: 08/18/2013 08:06   Dg Chest Portable 1 View  08/17/2013   CLINICAL DATA:  Drug overdose; hypoxia  EXAM: PORTABLE CHEST - 1 VIEW  COMPARISON:  July 08, 2013  FINDINGS: Endotracheal tube tip is in the right main bronchus. Central catheter tip is in the superior vena cava. No pneumothorax. There is significant left lower lobe collapse. Right lung is hyperexpanded but clear. Heart size and pulmonary vascularity are normal.  IMPRESSION: Endotracheal tube tip in right main bronchus. Advise withdrawing endotracheal tube approximately 7 cm. Partial collapse of the left lung. No pneumothorax.  Critical Value/emergent results were called by telephone at the time of  interpretation on 08/17/2013 at 8:20 PM to Dr. Glynn OctaveSTEPHEN RANCOUR , who verbally acknowledged these results.   Electronically Signed   By: Bretta BangWilliam  Woodruff M.D.   On: 08/17/2013 20:21   Dg Chest Port 1v Same Day  08/17/2013   CLINICAL DATA:  Drug overdose; hypoxia  EXAM: PORTABLE CHEST - 1 VIEW SAME DAY  COMPARISON:  None.  FINDINGS: Endotracheal tube tip is 3.4 cm above the carina. Central catheter tip is in the superior vena cava. Nasogastric tube tip and side port are in the stomach. No pneumothorax. There is consolidation in the left lower lobe. Lungs are otherwise clear. Heart size and pulmonary vascularity are normal.  IMPRESSION: Tube and catheter positions as described without appreciable pneumothorax. Left lower lobe consolidation present.   Electronically Signed   By: Bretta BangWilliam  Woodruff M.D.   On: 08/17/2013 21:02   Dg Abd Portable 1v  08/18/2013   CLINICAL DATA:  Orogastric tube placement  EXAM: PORTABLE ABDOMEN - 1 VIEW  COMPARISON:  Scout image from CT abdomen 02/20/2009  FINDINGS: Tip of nasogastric tube projects over the expected position of the distal second portion of the duodenum.  Bowel gas pattern normal.  Prior lumbar fusion.  Osseous demineralization.  No urinary tract calcification.  IMPRESSION: Tip of nasogastric tube projects over the expected position of the distal second portion of the duodenum.   Electronically Signed   By: Ulyses SouthwardMark  Boles M.D.   On: 08/18/2013 12:22   ASSESSMENT/PLAN:  NEUROLOGIC A:  Acute encephalopathy 2nd to renal/respiratory failure >> most likely from accidental opiate overdose. Recent lumbar spine surgery >> appreciate input from Dr. Jordan LikesPool. Hx of depression, anxiety. P:   PRN ativan, fentanyl IV until able to take pills May need to add precedex if agitation an issue after extubation Hold home meds of valium, elavil, klonopin, trazodone, norco, flexeril for now Lumbar spinel surgical site wound care  PULMONARY A: Acute respiratory failure 2nd to  altered mental status. P:   Extubate 4/23 F/u CXR  CARDIOVASCULAR A:  Shock >> most likely from hypovolemia, less likely sepsis. Hx of HTN with chronic diastolic dysfx, hyperlipidemia. P:  Goal even fluid balance Wean pressors to keep MAP > 65, SBP > 90 Hold outpt ASA, HCTZ, lopressor, pravachol for now  RENAL A:   AKI 2nd to volume depletion >> resolved 4/23. Metabolic acidosis >> from hypovolemia, renal failure, and metformin use >> resolved 4/23. Hypomagnesemia, hypophosphatemia. Hyperkalemia >> from acidosis >> resolved. P:  D/c HCO3 from IV fluid F/u and replace electrolytes as needed Monitor renal fx, urine outpt  GASTROINTESTINAL A:   Nutrition. Hx of GERD. P:   Advance diet after extubation pepcid for SUP  HEMATOLOGIC A:   Iron deficiency anemia >> no obvious bleeding. P:  Add ferrous sulfate when able to take pills SQ heparin for DVT prophylaxis F/u CBC Transfuse for Hb < 7  INFECTIOUS A:   ?sepsis, ? Aspiration PNA >> seems less likely P:   Day 3 zosyn >> d/c ABx soon if cx results negative D/c vancomycin 4/23  ENDOCRINE A:   Hx of DM type II. P:   SSI Hold outpt glucotrol, glucophage   CC time 40 minutes.  Coralyn HellingVineet Carla Whilden, MD [facility] Pulmonary/Critical Care 08/19/2013, 7:51 AM Pager:  [phone] After 3pm call: [phone]

## 2013-08-20 ENCOUNTER — Inpatient Hospital Stay (HOSPITAL_COMMUNITY): Payer: Medicaid Other

## 2013-08-20 DIAGNOSIS — I10 Essential (primary) hypertension: Secondary | ICD-10-CM

## 2013-08-20 LAB — BASIC METABOLIC PANEL
BUN: 17 mg/dL (ref 6–23)
CO2: 23 mEq/L (ref 19–32)
Calcium: 8.4 mg/dL (ref 8.4–10.5)
Chloride: 107 mEq/L (ref 96–112)
Creatinine, Ser: 0.7 mg/dL (ref 0.50–1.10)
GFR calc Af Amer: >90 mL/min (ref >90)
GFR calc non Af Amer: >90 mL/min (ref >90)
GLUCOSE: 103 mg/dL — AB (ref 70–99)
Potassium: 3.6 mEq/L — ABNORMAL LOW (ref 3.7–5.3)
SODIUM: 144 meq/L (ref 137–147)

## 2013-08-20 LAB — MAGNESIUM: MAGNESIUM: 1.1 mg/dL — AB (ref 1.5–2.5)

## 2013-08-20 LAB — CULTURE, RESPIRATORY

## 2013-08-20 LAB — CBC
HCT: 22.4 % — ABNORMAL LOW (ref 36.0–46.0)
Hemoglobin: 7.2 g/dL — ABNORMAL LOW (ref 12.0–15.0)
MCH: 27.2 pg (ref 26.0–34.0)
MCHC: 32.1 g/dL (ref 30.0–36.0)
MCV: 84.5 fL (ref 78.0–100.0)
Platelets: 315 10*3/uL (ref 150–400)
RBC: 2.65 MIL/uL — ABNORMAL LOW (ref 3.87–5.11)
RDW: 13.7 % (ref 11.5–15.5)
WBC: 6.1 10*3/uL (ref 4.0–10.5)

## 2013-08-20 LAB — GLUCOSE, CAPILLARY
GLUCOSE-CAPILLARY: 135 mg/dL — AB (ref 70–99)
Glucose-Capillary: 111 mg/dL — ABNORMAL HIGH (ref 70–99)

## 2013-08-20 LAB — CLOSTRIDIUM DIFFICILE BY PCR: Toxigenic C. Difficile by PCR: NEGATIVE

## 2013-08-20 LAB — PHOSPHORUS: Phosphorus: 2.4 mg/dL (ref 2.3–4.6)

## 2013-08-20 LAB — CULTURE, RESPIRATORY W GRAM STAIN

## 2013-08-20 MED ORDER — SODIUM CHLORIDE 0.9 % IV SOLN
INTRAVENOUS | Status: DC | PRN
  Administered 2013-08-20: 10:00:00 via INTRAVENOUS

## 2013-08-20 MED ORDER — TRAZODONE HCL 50 MG PO TABS
50.0000 mg | ORAL_TABLET | Freq: Every day | ORAL | Status: DC
  Administered 2013-08-20 – 2013-08-22 (×3): 50 mg via ORAL
  Filled 2013-08-20 (×4): qty 1

## 2013-08-20 MED ORDER — CLONAZEPAM 0.5 MG PO TABS: 0.5000 mg | ORAL_TABLET | Freq: Three times a day (TID) | ORAL | Status: DC | PRN

## 2013-08-20 MED ORDER — OXYCODONE-ACETAMINOPHEN 5-325 MG PO TABS
1.0000 | ORAL_TABLET | Freq: Four times a day (QID) | ORAL | Status: DC | PRN
  Administered 2013-08-23: 1 via ORAL
  Filled 2013-08-20: qty 1

## 2013-08-20 MED ORDER — CITALOPRAM HYDROBROMIDE 20 MG PO TABS
20.0000 mg | ORAL_TABLET | Freq: Every day | ORAL | Status: DC
  Administered 2013-08-20 – 2013-08-23 (×4): 20 mg via ORAL
  Filled 2013-08-20 (×4): qty 1

## 2013-08-20 MED ORDER — METFORMIN HCL 500 MG PO TABS
1000.0000 mg | ORAL_TABLET | Freq: Two times a day (BID) | ORAL | Status: DC
  Administered 2013-08-20 – 2013-08-23 (×6): 1000 mg via ORAL
  Filled 2013-08-20 (×8): qty 2

## 2013-08-20 MED ORDER — METOPROLOL TARTRATE 50 MG PO TABS
50.0000 mg | ORAL_TABLET | Freq: Every day | ORAL | Status: DC
  Administered 2013-08-20 – 2013-08-21 (×2): 50 mg via ORAL
  Filled 2013-08-20 (×2): qty 1

## 2013-08-20 MED ORDER — AMITRIPTYLINE HCL 25 MG PO TABS
25.0000 mg | ORAL_TABLET | Freq: Every day | ORAL | Status: DC
  Administered 2013-08-20 – 2013-08-22 (×3): 25 mg via ORAL
  Filled 2013-08-20 (×5): qty 1

## 2013-08-20 MED ORDER — FERROUS SULFATE 325 (65 FE) MG PO TABS
325.0000 mg | ORAL_TABLET | Freq: Two times a day (BID) | ORAL | Status: DC
  Administered 2013-08-20 – 2013-08-23 (×7): 325 mg via ORAL
  Filled 2013-08-20 (×9): qty 1

## 2013-08-20 MED ORDER — LISINOPRIL 10 MG PO TABS
10.0000 mg | ORAL_TABLET | Freq: Every day | ORAL | Status: DC
  Administered 2013-08-21 – 2013-08-22 (×2): 10 mg via ORAL
  Filled 2013-08-20 (×2): qty 1

## 2013-08-20 MED ORDER — ACETAMINOPHEN 325 MG PO TABS: 650.0000 mg | ORAL_TABLET | Freq: Four times a day (QID) | ORAL | Status: DC | PRN

## 2013-08-20 MED ORDER — HYDROCHLOROTHIAZIDE 12.5 MG PO CAPS
12.5000 mg | ORAL_CAPSULE | Freq: Every day | ORAL | Status: DC
  Administered 2013-08-21 – 2013-08-23 (×3): 12.5 mg via ORAL
  Filled 2013-08-20 (×3): qty 1

## 2013-08-20 MED ORDER — CYCLOBENZAPRINE HCL 10 MG PO TABS
10.0000 mg | ORAL_TABLET | Freq: Every evening | ORAL | Status: DC | PRN
  Filled 2013-08-20: qty 1

## 2013-08-20 MED ORDER — ENSURE COMPLETE PO LIQD
237.0000 mL | Freq: Two times a day (BID) | ORAL | Status: DC
  Administered 2013-08-20 – 2013-08-23 (×6): 237 mL via ORAL

## 2013-08-20 MED ORDER — GLIPIZIDE 5 MG PO TABS
5.0000 mg | ORAL_TABLET | Freq: Every day | ORAL | Status: DC
  Administered 2013-08-21 – 2013-08-23 (×3): 5 mg via ORAL
  Filled 2013-08-20 (×4): qty 1

## 2013-08-20 MED ORDER — GABAPENTIN 300 MG PO CAPS
300.0000 mg | ORAL_CAPSULE | Freq: Three times a day (TID) | ORAL | Status: DC
  Administered 2013-08-20 – 2013-08-23 (×10): 300 mg via ORAL
  Filled 2013-08-20 (×12): qty 1

## 2013-08-20 MED ORDER — HYDRALAZINE HCL 20 MG/ML IJ SOLN
10.0000 mg | INTRAMUSCULAR | Status: DC | PRN
  Filled 2013-08-20: qty 1

## 2013-08-20 NOTE — Progress Notes (Signed)
eLink Physician-Brief Progress Note Patient Name: Merlinda FrederickBrenda M Shimkus DOB: 01-10-1961 MRN: [id number]  Date of Service  08/20/2013   HPI/Events of Note  BP 174/110   eICU Interventions  PRN hydralazine Resume home antihypertensives tomorrow AM   Intervention Category Major Interventions: Hypertension - evaluation and management  Merwyn KatosDavid B Emmalyn Hinson 08/20/2013, 6:09 PM

## 2013-08-20 NOTE — Progress Notes (Signed)
NUTRITION FOLLOW-UP  INTERVENTION:  1. Encourage PO intake  2. Ensure Complete po BID, each supplement provides 350 kcal and 13 grams of protein   NUTRITION DIAGNOSIS: Inadequate oral intake related to depression as evidenced by PO <25%; ongoing.   Goal: Pt to meet >/= 90% of their estimated nutrition needs   Monitor:  PO intake, weight trend, labs    ASSESSMENT: [facility], required intubation. Pt is s/p L4-5 decompression and fusion 4 weeks ago, which was uncomplicated. No signs of incision infection at this time.   Pt extubated 4/23 am. Pt discussed during ICU rounds and with RN. Per RN pt has been refusing to eat, pt with hx of depression and anxiety, medications started.   Pt had episode of diarrhea, c diff pending.  Potassium and Magnesium low and being repleted.   Height: Ht Readings from Last 1 Encounters:  08/18/13 5\' 3"  (1.6 m)    Weight: Wt Readings from Last 1 Encounters:  08/20/13 184 lb 8.4 oz (83.7 kg)  Admission weight 189 lb (85.7 kg)  BMI:  Body mass index is 32.7 kg/(m^2).  Estimated Nutritional Needs: Kcal: 1700-1900 Protein: 80-90 grams Fluid: > 1.7 L/day  Skin: lower back incision   Diet Order: Carb Control   Intake/Output Summary (Last 24 hours) at 08/20/13 1010 Last data filed at 08/20/13 0900  Gross per 24 hour  Intake 1524.28 ml  Output   1445 ml  Net  79.28 ml    Last BM: 4/24   Labs:   Recent Labs Lab 08/18/13 0500 08/18/13 1600 08/19/13 0550 08/20/13 0500  NA 145 146 144 144  K 4.3 2.7* 3.9 3.6*  CL 111 114* 107 107  CO2 17* 18* 25 23  BUN 46* 22 28* 17  CREATININE 2.57* 0.89 0.76 0.70  CALCIUM 8.1* 6.3* 8.7 8.4  MG 2.1  --  1.0* 1.1*  PHOS 4.3  --  1.4* 2.4  GLUCOSE 117* 148* 148* 103*    CBG (last 3)   Recent Labs  08/19/13 1226 08/19/13 1712 08/19/13 2209  GLUCAP 152* 99 92   Lab Results  Component Value Date   HGBA1C 10.4* 07/23/2013   Scheduled Meds: .  amitriptyline  25 mg Oral QHS  . antiseptic oral rinse  15 mL Mouth Rinse QID  . chlorhexidine  15 mL Mouth Rinse BID  . citalopram  20 mg Oral Daily  . ferrous sulfate  325 mg Oral BID WC  . gabapentin  300 mg Oral TID  . [START ON 08/21/2013] glipiZIDE  5 mg Oral QAC breakfast  . heparin  5,000 Units Subcutaneous 3 times per day  . insulin aspart  0-15 Units Subcutaneous TID WC  . metFORMIN  1,000 mg Oral BID WC  . metoprolol  50 mg Oral Daily  . traZODone  50 mg Oral QHS    Continuous Infusions:    Kendell BaneHeather Alynah Schone RD, LDN, CNSC [phone] Pager [phone] After Hours Pager

## 2013-08-20 NOTE — Progress Notes (Signed)
PULMONARY / CRITICAL CARE MEDICINE   Name: Betty Fuentes MRN: [id number] DOB: [birth year]    ADMISSION DATE:  08/17/2013  REFERRING MD :  [facility]  CHIEF COMPLAINT:  Altered mental status  BRIEF PATIENT DESCRIPTION:  53 yo female [facility] unresponsive by friend [facility]  [facility]  [facility].  She had recent L4/L5 fusion and taking frequent narcotic pain medications.  SIGNIFICANT EVENTS: 4/22 Admit with acute respiratory failure, neurosurgery consulted 4/23 d/c HCO3 gtt, off pressors 4/24 Transfer to medical floor  STUDIES:  4/21 CT head >> Lt frontal scalp hematoma 4/21 CT lumbar spine >> no focal fluid collections  LINES / TUBES: OETT 4/21>> 4/23 R IJ CVL 4/21>>  CULTURES: BC 4/21>> Resp 4/21>> UC 4/21>> negative C diff 4/24 >>  ANTIBIOTICS: Vanc 4/21>> 4/23 Zosyn 4/21>> 4/24  SUBJECTIVE:  Developed diarrhea overnight.  Needed haldol overnight.  Denies chest pain/dyspnea.  Does not recall what happened before she came to hospital.  VITAL SIGNS: Temp:  [97.2 F (36.2 C)-99.5 F (37.5 C)] 98.1 F (36.7 C) (04/24 0800) Pulse Rate:  [83-115] 105 (04/24 0800) Resp:  [11-37] 31 (04/24 0720) BP: (96-152)/(52-93) 146/92 mmHg (04/24 0800) SpO2:  [90 %-100 %] 98 % (04/24 0800) Weight:  [184 lb 8.4 oz (83.7 kg)] 184 lb 8.4 oz (83.7 kg) (04/24 0500)  INTAKE / OUTPUT: Intake/Output     04/23 0701 - 04/24 0700 04/24 0701 - 04/25 0700   I.V. (mL/kg) 826.3 (9.9) 50 (0.6)   NG/GT     IV Piggyback 804    Total Intake(mL/kg) 1630.3 (19.5) 50 (0.6)   Urine (mL/kg/hr) 1620 (0.8)    Total Output 1620     Net +10.3 +50        Stool Occurrence 8 x     PHYSICAL EXAMINATION: General: no distress   Neuro: alert, normal strength, follows commands HEENT: no sinus tenderness Cardiovascular: regular, no murmur Lungs: scattered rhonchi Abdomen: soft, non tender, + bowel  sounds Musculoskeletal: no edema Skin: no rashes Back: lumbar spine wound partially open superficially with clear fluid drainage  LABS:  CBC  Recent Labs Lab 08/18/13 0500 08/19/13 0550 08/20/13 0500  WBC 14.6* 7.6 6.1  HGB 8.1* 7.2* 7.2*  HCT 25.8* 22.9* 22.4*  PLT 441* 384 315   Coag's  Recent Labs Lab 08/17/13 1816  INR 1.14   BMET  Recent Labs Lab 08/18/13 1600 08/19/13 0550 08/20/13 0500  NA 146 144 144  K 2.7* 3.9 3.6*  CL 114* 107 107  CO2 18* 25 23  BUN 22 28* 17  CREATININE 0.89 0.76 0.70  GLUCOSE 148* 148* 103*   Electrolytes  Recent Labs Lab 08/18/13 0500 08/18/13 1600 08/19/13 0550 08/20/13 0500  CALCIUM 8.1* 6.3* 8.7 8.4  MG 2.1  --  1.0* 1.1*  PHOS 4.3  --  1.4* 2.4   Sepsis Markers  Recent Labs Lab 08/17/13 1816 08/17/13 2310 08/18/13 0500 08/19/13 0550  LATICACIDVEN  --  1.1  --   --   PROCALCITON 2.24  --  0.80 0.26   ABG  Recent Labs Lab 08/18/13 0440 08/18/13 0630 08/18/13 1657  PHART 7.243* 7.378 7.357  PCO2ART 41.8 27.7* 47.9*  PO2ART 157.0* 149.0* 142.0*   Liver Enzymes  Recent Labs Lab 08/17/13 1816  AST 34  ALT 15  ALKPHOS 108  BILITOT 0.2*  ALBUMIN 3.0*   Cardiac Enzymes  Recent  Labs Lab 08/17/13 2359 08/18/13 0925 08/18/13 1148  TROPONINI <0.30 <0.30 <0.30   Glucose  Recent Labs Lab 08/18/13 2321 08/19/13 0320 08/19/13 0815 08/19/13 1226 08/19/13 1712 08/19/13 2209  GLUCAP 142* 114* 104* 152* 99 92    Iron/TIBC/Ferritin    Component Value Date/Time   IRON 16* 08/18/2013 0925   TIBC 190* 08/18/2013 0925   FERRITIN 260 08/18/2013 0925    Imaging Dg Chest Port 1 View  08/20/2013   CLINICAL DATA:  followup atelectasis  EXAM: PORTABLE CHEST - 1 VIEW  COMPARISON:  08/19/2013  FINDINGS: Interval removal of endotracheal tube. Right jugular catheter tip in the SVC. NG tube removed.  Cardiac enlargement with increased vascular congestion and increased airspace opacity on the right  suggesting edema. No effusion. The patient is rotated to the left. Improved lung volume since the prior study.  IMPRESSION: Cardiac enlargement with possible mild fluid overload.  Improved lung volume compared with the prior study. Endotracheal tube and NG tube have been removed.   Electronically Signed   By: Marlan Palau M.D.   On: 08/20/2013 07:44   Dg Chest Port 1 View  08/19/2013   CLINICAL DATA:  Atelectasis  EXAM: PORTABLE CHEST - 1 VIEW  COMPARISON:  DG CHEST 1V PORT dated 08/18/2013; DG CHEST PORT 1VSAME DAY dated 08/17/2013; DG CHEST 2 VIEW dated 07/08/2013; CT CHEST W/CM dated 02/20/2009  FINDINGS: Grossly unchanged cardiac silhouette and mediastinal contours given persistently reduced lung volumes and patient rotation. Stable position of support apparatus. No pneumothorax. Improved aeration of the lungs with persistent bibasilar opacities, right greater than left. Trace left-sided effusion is not excluded. No evidence of edema. Unchanged bones.  IMPRESSION: 1.  Stable positioning of support apparatus.  No pneumothorax. 2. Improved aeration of the lungs with persistent bibasilar opacities, right greater than left, atelectasis versus infiltrate.   Electronically Signed   By: Simonne Come M.D.   On: 08/19/2013 08:13   Dg Abd Portable 1v  08/18/2013   CLINICAL DATA:  Orogastric tube placement  EXAM: PORTABLE ABDOMEN - 1 VIEW  COMPARISON:  Scout image from CT abdomen 02/20/2009  FINDINGS: Tip of nasogastric tube projects over the expected position of the distal second portion of the duodenum.  Bowel gas pattern normal.  Prior lumbar fusion.  Osseous demineralization.  No urinary tract calcification.  IMPRESSION: Tip of nasogastric tube projects over the expected position of the distal second portion of the duodenum.   Electronically Signed   By: Ulyses Southward M.D.   On: 08/18/2013 12:22   ASSESSMENT/PLAN:  NEUROLOGIC A:  Acute encephalopathy 2nd to renal/respiratory failure >> most likely from  accidental opiate overdose. Recent lumbar spine surgery >> appreciate input from Dr. Jordan Likes. Hx of depression, anxiety. Deconditioning. P:   Resume elavil, flexeril, trazodone PRN klonopin or IV ativan PRN tylenol, percocet, fentanyl for pain Lumbar spinel surgical site wound care D/c haldol PT/OT evaluation  PULMONARY A: Acute respiratory failure 2nd to altered mental status >> extubated 4/23. P:   Bronchial hygiene Oxygen to keep SaO2 > 92%  CARDIOVASCULAR A:  Shock >> most likely from hypovolemia, less likely sepsis >> resolved. Hx of HTN with chronic diastolic dysfx. P:  Goal even fluid balance >> KVO IV fluids Resume lopressor Hold outpt ASA, HCTZ, lisinopril  for now  RENAL A:   AKI 2nd to volume depletion >> resolved 4/23. Metabolic acidosis >> from hypovolemia, renal failure, and metformin use >> resolved 4/23. Hypomagnesemia, hypophosphatemia. Hyperkalemia >> from acidosis >> resolved.  P:   F/u and replace electrolytes as needed Monitor renal fx, urine outpt D/c foley 4/24  GASTROINTESTINAL A:   Nutrition. Hx of GERD. Diarrhea in evening of 4/23. P:   Carb modified diet D/c pepcid >> SUP no longer needed   HEMATOLOGIC A:   Iron deficiency anemia >> no obvious bleeding. P:  Add ferrous sulfate 4/24 SQ heparin for DVT prophylaxis F/u CBC Transfuse for Hb < 7  INFECTIOUS A:   ?sepsis, ? Aspiration PNA >> seems less likely. Diarrhea developed 4/24. P:   D/c Abx 4/24 F/u stool C diff PCR D/c CVL 4/24  ENDOCRINE A:   Hx of DM type II. P:   SSI Resume glucotrol, glucophage 4/24  Transfer to non tele floor bed 4/24.  Keep on PCCM service since likely ready for d/c home in next 24 to 48 hours.  Coralyn HellingVineet Taija Mathias, MD [facility] 08/20/2013, 9:18 AM Pager:  [phone] After 3pm call: [phone]

## 2013-08-20 NOTE — Progress Notes (Signed)
Pt bp 172/110, no prn bp meds. MD notified via [phone], going to add PRN hydralazine. Will continue to monitor.

## 2013-08-20 NOTE — Progress Notes (Signed)
Pt attempted to drink ensure, vomited immediately. No cx of nausea after episode. Will continue to monitor.

## 2013-08-20 NOTE — Evaluation (Signed)
Physical Therapy Evaluation Patient Details Name: Betty Fuentes MRN: [id number] DOB: Oct 26, 1960 Today's Date: 08/20/2013   History of Present Illness  Pt admit with acute encephalopathy.    Clinical Impression  Pt admitted with above. Pt currently with functional limitations due to the deficits listed below (see PT Problem List).  Pt will benefit from skilled PT to increase their independence and safety with mobility to allow discharge to the venue listed below.     Follow Up Recommendations SNF;Supervision/Assistance - 24 hour    Equipment Recommendations  None recommended by PT    Recommendations for Other Services       Precautions / Restrictions Precautions Precautions: Fall Restrictions Weight Bearing Restrictions: No      Mobility  Bed Mobility Overal bed mobility: Needs Assistance;+2 for physical assistance Bed Mobility: Supine to Sit     Supine to sit: Min assist     General bed mobility comments: Incr time needed.  cues for sequencing.  Transfers Overall transfer level: Needs assistance Equipment used: Rolling walker (2 wheeled);None Transfers: Sit to/from Stand Sit to Stand: Mod assist         General transfer comment: Pt had difficulty with sit to stand and getting her stability once standing.  Uncoordinated movement in LEs.    Ambulation/Gait Ambulation/Gait assistance: Mod assist;+2 physical assistance Ambulation Distance (Feet): 87 Feet Assistive device: Rolling walker (2 wheeled) Gait Pattern/deviations: Step-through pattern;Decreased stride length;Shuffle;Ataxic;Staggering left;Staggering right;Drifts right/left   Gait velocity interpretation: Below normal speed for age/gender General Gait Details: Pt ambulated without RW and was unsteady on her feet. Tried RW and pt still with ataxic, uncoordinated steps at times as well as with incr postural sway causing pt to lose balance and need steadying assist.    Stairs            Wheelchair  Mobility    Modified Rankin (Stroke Patients Only)       Balance Overall balance assessment: Needs assistance;History of Falls         Standing balance support: Bilateral upper extremity supported;During functional activity Standing balance-Leahy Scale: Poor Standing balance comment: Pt relies heavily on use of UEs on RW.  Very unsteady on her feet with and without use of RW.              High level balance activites: Turns High Level Balance Comments: Had difficulty with turns needing incr assist.              Pertinent Vitals/Pain VSS, No pain    Home Living Family/patient expects to be discharged to:: Private residence Living Arrangements: Alone   Type of Home: Mobile home Home Access: Stairs to enter Entrance Stairs-Rails: Right Entrance Stairs-Number of Steps: 3 Home Layout: One level Home Equipment: Walker - 4 wheels;Cane - single point;Wheelchair - [employer]manual;Bedside commode;Shower seat Additional Comments: garden tub shower, standard height toilets    Prior Function Level of Independence: Independent               Hand Dominance   Dominant Hand: Left    Extremity/Trunk Assessment   Upper Extremity Assessment: Defer to OT evaluation           Lower Extremity Assessment: Generalized weakness         Communication   Communication: No difficulties  Cognition Arousal/Alertness: Awake/alert Behavior During Therapy: Flat affect Overall Cognitive Status: Impaired/Different from baseline Area of Impairment: Orientation;Memory;Following commands;Safety/judgement;Awareness;Problem solving Orientation Level: Disoriented to;Situation;Time   Memory: Decreased short-term memory Following Commands: Follows one step  commands with increased time Safety/Judgement: Decreased awareness of safety;Decreased awareness of deficits   Problem Solving: Decreased initiation;Slow processing;Requires verbal cues;Requires tactile cues General Comments: Pt  confused.  Not following commands without repetition.    General Comments      Exercises        Assessment/Plan    PT Assessment Patient needs continued PT services  PT Diagnosis Generalized weakness   PT Problem List Decreased strength;Decreased activity tolerance;Decreased balance;Decreased mobility;Decreased knowledge of use of DME;Decreased safety awareness;Decreased knowledge of precautions  PT Treatment Interventions DME instruction;Gait training;Functional mobility training;Therapeutic activities;Therapeutic exercise;Balance training;Patient/family education   PT Goals (Current goals can be found in the Care Plan section) Acute Rehab PT Goals Patient Stated Goal: to go home PT Goal Formulation: With patient Time For Goal Achievement: 08/27/13 Potential to Achieve Goals: Good    Frequency Min 3X/week   Barriers to discharge Decreased caregiver support      Co-evaluation               End of Session Equipment Utilized During Treatment: Gait belt Activity Tolerance: Patient limited by fatigue Patient left: in chair;with call bell/phone within reach;with nursing/sitter in room Nurse Communication: Mobility status         Time: 1330-1346 PT Time Calculation (min): 16 min   Charges:   PT Evaluation $Initial PT Evaluation Tier I: 1 Procedure PT Treatments $Gait Training: 8-22 mins   PT G Codes:          Barb MerinoDawn Ingold 08/20/2013, 3:41 PM [facility],PT Acute Rehabilitation [phone] (pager)

## 2013-08-21 LAB — CBC
HEMATOCRIT: 26.8 % — AB (ref 36.0–46.0)
HEMOGLOBIN: 8.4 g/dL — AB (ref 12.0–15.0)
MCH: 26.7 pg (ref 26.0–34.0)
MCHC: 31.3 g/dL (ref 30.0–36.0)
MCV: 85.1 fL (ref 78.0–100.0)
Platelets: 381 10*3/uL (ref 150–400)
RBC: 3.15 MIL/uL — ABNORMAL LOW (ref 3.87–5.11)
RDW: 13.9 % (ref 11.5–15.5)
WBC: 9.3 10*3/uL (ref 4.0–10.5)

## 2013-08-21 LAB — BASIC METABOLIC PANEL
BUN: 16 mg/dL (ref 6–23)
CALCIUM: 8.5 mg/dL (ref 8.4–10.5)
CO2: 19 mEq/L (ref 19–32)
CREATININE: 0.68 mg/dL (ref 0.50–1.10)
Chloride: 108 mEq/L (ref 96–112)
GFR calc Af Amer: >90 mL/min (ref >90)
GFR calc non Af Amer: >90 mL/min (ref >90)
Glucose, Bld: 106 mg/dL — ABNORMAL HIGH (ref 70–99)
Potassium: 3.8 mEq/L (ref 3.7–5.3)
Sodium: 144 mEq/L (ref 137–147)

## 2013-08-21 LAB — GLUCOSE, CAPILLARY
GLUCOSE-CAPILLARY: 68 mg/dL — AB (ref 70–99)
GLUCOSE-CAPILLARY: 53 mg/dL — AB (ref 70–99)
GLUCOSE-CAPILLARY: 85 mg/dL (ref 70–99)
Glucose-Capillary: 118 mg/dL — ABNORMAL HIGH (ref 70–99)
Glucose-Capillary: 107 mg/dL — ABNORMAL HIGH (ref 70–99)
Glucose-Capillary: 117 mg/dL — ABNORMAL HIGH (ref 70–99)
Glucose-Capillary: 135 mg/dL — ABNORMAL HIGH (ref 70–99)

## 2013-08-21 LAB — MAGNESIUM: MAGNESIUM: 1.3 mg/dL — AB (ref 1.5–2.5)

## 2013-08-21 MED ORDER — GLUCOSE 40 % PO GEL
ORAL | Status: AC
  Administered 2013-08-21: 37.5 g
  Filled 2013-08-21: qty 1

## 2013-08-21 MED ORDER — GLUCOSE 40 % PO GEL: 1.0000 | ORAL | Status: DC | PRN

## 2013-08-21 MED ORDER — METOPROLOL TARTRATE 50 MG PO TABS
50.0000 mg | ORAL_TABLET | Freq: Two times a day (BID) | ORAL | Status: DC
  Administered 2013-08-21 – 2013-08-23 (×4): 50 mg via ORAL
  Filled 2013-08-21 (×5): qty 1

## 2013-08-21 NOTE — Progress Notes (Signed)
PULMONARY / CRITICAL CARE MEDICINE   Name: Betty FrederickBrenda M Fuentes MRN: [id number] DOB: Jun 05, 1960    ADMISSION DATE:  08/17/2013  REFERRING MD :  [facility]  CHIEF COMPLAINT:  Altered mental status  BRIEF PATIENT DESCRIPTION:  53 yo female found unresponsive by friend and [facility]  [facility]  [facility].  She had recent L4/L5 fusion and taking frequent narcotic pain medications.  SIGNIFICANT EVENTS: 4/22 Admit with acute respiratory failure, neurosurgery consulted 4/23 d/c HCO3 gtt, off pressors 4/24 Transfer to medical floor  STUDIES:  4/21 CT head >> Lt frontal scalp hematoma 4/21 CT lumbar spine >> no focal fluid collections  LINES / TUBES: OETT 4/21>> 4/23 R IJ CVL 4/21>>  CULTURES: BC 4/21>> Resp 4/21>> UC 4/21>> negative C diff 4/24 >>  ANTIBIOTICS: Vanc 4/21>> 4/23 Zosyn 4/21>> 4/24  SUBJECTIVE:  Feels better today.  No new complaints, communicative  VITAL SIGNS: Temp:  [98 F (36.7 C)-99.2 F (37.3 C)] 98.2 F (36.8 C) (04/25 1300) Pulse Rate:  [96-114] 114 (04/25 1300) Resp:  [18-22] 19 (04/25 1300) BP: (130-172)/(89-110) 149/103 mmHg (04/25 1300) SpO2:  [95 %-99 %] 95 % (04/25 1300) Weight:  [182 lb 12.2 oz (82.9 kg)] 182 lb 12.2 oz (82.9 kg) (04/25 0500)  INTAKE / OUTPUT: Intake/Output     04/24 0701 - 04/25 0700 04/25 0701 - 04/26 0700   P.O. 960    I.V. (mL/kg) 157.5 (1.9)    Other 75    IV Piggyback     Total Intake(mL/kg) 1192.5 (14.4)    Urine (mL/kg/hr) 430 (0.2)    Stool 2 (0)    Total Output 432     Net +760.5          Urine Occurrence 1 x    Stool Occurrence 4 x     PHYSICAL EXAMINATION: General: ow female, nad   Neuro: alert, normal strength, follows commands HEENT: nose without purulence or d/c noted Cardiovascular: mild tachy, regular Lungs: clear with no wheezing Abdomen: soft, non tender, + bowel sounds Musculoskeletal: no  edema   ASSESSMENT/PLAN:  NEUROLOGIC A:  Acute encephalopathy 2nd to renal/respiratory failure >> most likely from accidental opiate overdose.  Appears to be totally resolved today Recent lumbar spine surgery >> appreciate input from Dr. Jordan LikesPool. Hx of depression, anxiety. Deconditioning. P:   Lumbar spinel surgical site wound care PT/OT evaluation   CARDIOVASCULAR A:  Shock >> most likely from hypovolemia, less likely sepsis >> resolved. Hx of HTN with chronic diastolic dysfx.  Has had elevated pressures, and will need to get back on her usual home meds. P:  -restart home bp meds   RENAL A:   AKI 2nd to volume depletion >> resolved 4/23. Metabolic acidosis >> from hypovolemia, renal failure, and metformin use >> resolved 4/23. Hypomagnesemia, hypophosphatemia. Hyperkalemia >> from acidosis >> resolved. P:   F/u and replace electrolytes as needed Monitor renal fx, urine outpt D/c foley 4/24  GASTROINTESTINAL A:   Nutrition. Hx of GERD. Diarrhea in evening of 4/23. P:   Carb modified diet D/c pepcid >> SUP no longer needed   HEMATOLOGIC A:   Iron deficiency anemia >> no obvious bleeding. P:  Add ferrous sulfate 4/24 SQ heparin for DVT prophylaxis F/u CBC Transfuse for Hb < 7   ENDOCRINE A:   Hx of DM type II. P:   SSI Resume glucotrol, glucophage 4/24  Plan is to  restart home bp meds, and make sure bp well controlled.  ?d/c home on Monday?

## 2013-08-22 LAB — GLUCOSE, CAPILLARY
GLUCOSE-CAPILLARY: 99 mg/dL (ref 70–99)
Glucose-Capillary: 105 mg/dL — ABNORMAL HIGH (ref 70–99)
Glucose-Capillary: 96 mg/dL (ref 70–99)
Glucose-Capillary: 106 mg/dL — ABNORMAL HIGH (ref 70–99)
Glucose-Capillary: 106 mg/dL — ABNORMAL HIGH (ref 70–99)

## 2013-08-22 LAB — CULTURE, BLOOD (ROUTINE X 2)
CULTURE: NO GROWTH
Culture: NO GROWTH

## 2013-08-22 MED ORDER — LISINOPRIL 20 MG PO TABS
20.0000 mg | ORAL_TABLET | Freq: Every day | ORAL | Status: DC
  Administered 2013-08-23: 20 mg via ORAL
  Filled 2013-08-22: qty 1

## 2013-08-22 NOTE — Progress Notes (Signed)
Hypoglycemic Event  CBG: 53  Treatment: 8oz of soda +37.5 gram of glucose gel  Symptoms: none  Follow-up CBG: Time:1257 CBG Result: 85  Possible Reasons for Event: unknown  Comments/MD notified:yes    Kem KaysAlbert Tolentino Danika Kluender  Remember to initiate Hypoglycemia Order Set & complete

## 2013-08-22 NOTE — Progress Notes (Signed)
PULMONARY / CRITICAL CARE MEDICINE   Name: Betty FrederickBrenda M Fuentes MRN: [id number] DOB: [birth year]    ADMISSION DATE:  08/17/2013  REFERRING MD :  [facility]  CHIEF COMPLAINT:  Altered mental status  BRIEF PATIENT DESCRIPTION:  53 yo female [facility] by friend [facility]  [facility]  [facility].  She had recent L4/L5 fusion and taking frequent narcotic pain medications.  SIGNIFICANT EVENTS: 4/22 Admit with acute respiratory failure, neurosurgery consulted 4/23 d/c HCO3 gtt, off pressors 4/24 Transfer to medical floor  STUDIES:  4/21 CT head >> Lt frontal scalp hematoma 4/21 CT lumbar spine >> no focal fluid collections  LINES / TUBES: OETT 4/21>> 4/23 R IJ CVL 4/21>>  CULTURES: BC 4/21>> Resp 4/21>> UC 4/21>> negative C diff 4/24 >>  ANTIBIOTICS: Vanc 4/21>> 4/23 Zosyn 4/21>> 4/24  SUBJECTIVE:  Doing well, no increased wob.  Wants to go home.   VITAL SIGNS: Temp:  [98.1 F (36.7 C)-99.9 F (37.7 C)] 98.7 F (37.1 C) (04/26 0950) Pulse Rate:  [98-114] 103 (04/26 0950) Resp:  [18-19] 18 (04/26 0950) BP: (126-155)/(83-112) 138/96 mmHg (04/26 0950) SpO2:  [95 %-98 %] 98 % (04/26 0950) Weight:  [182 lb 14.4 oz (82.963 kg)] 182 lb 14.4 oz (82.963 kg) (04/25 2110)  INTAKE / OUTPUT: Intake/Output     04/25 0701 - 04/26 0700 04/26 0701 - 04/27 0700   P.O. 600 360   I.V. (mL/kg)     Other     Total Intake(mL/kg) 600 (7.2) 360 (4.3)   Urine (mL/kg/hr)     Stool     Total Output       Net +600 +360        Urine Occurrence 2 x    Stool Occurrence      PHYSICAL EXAMINATION: General: ow female, nad   Neuro: alert, normal strength, follows commands HEENT: nose without purulence or d/c noted Cardiovascular: RRR Lungs: clear with no wheezing, minimal basilar crackles. Abdomen: soft, non tender, + bowel sounds Musculoskeletal: minimal LE  edema   ASSESSMENT/PLAN:  NEUROLOGIC A:  Acute encephalopathy 2nd to renal/respiratory failure >> most likely from accidental opiate overdose.  Appears to be totally resolved today Recent lumbar spine surgery >> appreciate input from Dr. Jordan LikesPool. Hx of depression, anxiety. Deconditioning. P:   Lumbar spinel surgical site wound care PT/OT evaluation   CARDIOVASCULAR A:  Shock >> most likely from hypovolemia, less likely sepsis >> resolved. Hx of HTN with chronic diastolic dysfx.  Has had elevated pressures, and will need to get back on her usual home meds. P:  -home bp meds restarted, and ace added.      HEMATOLOGIC A:   Iron deficiency anemia >> no obvious bleeding. P:  Add ferrous sulfate 4/24 SQ heparin for DVT prophylaxis F/u CBC Transfuse for Hb < 7   ENDOCRINE A:   Hx of DM type II. P:   SSI Resume glucotrol, glucophage 4/24  ?d/c home in am.

## 2013-08-22 NOTE — Progress Notes (Signed)
Hypoglycemic Event  CBG: 68  Treatment: 8 0z soda  Symptoms: none  Follow-up CBG: Time:1152 CBG Result: 53  Possible Reasons for Event: unknown  Comments/MD notified:yes    Kem KaysAlbert Tolentino Dewan Emond  Remember to initiate Hypoglycemia Order Set & complete

## 2013-08-23 LAB — GLUCOSE, CAPILLARY: Glucose-Capillary: 136 mg/dL — ABNORMAL HIGH (ref 70–99)

## 2013-08-23 MED ORDER — FERROUS SULFATE 325 (65 FE) MG PO TABS: 325.0000 mg | ORAL_TABLET | Freq: Two times a day (BID) | ORAL | Status: AC

## 2013-08-23 MED ORDER — TRAZODONE HCL 100 MG PO TABS: 50.0000 mg | ORAL_TABLET | Freq: Every evening | ORAL | Status: AC | PRN

## 2013-08-23 MED ORDER — IBUPROFEN 800 MG PO TABS: 800.0000 mg | ORAL_TABLET | Freq: Three times a day (TID) | ORAL | Status: AC | PRN

## 2013-08-23 MED ORDER — GABAPENTIN 300 MG PO CAPS: 300.0000 mg | ORAL_CAPSULE | Freq: Three times a day (TID) | ORAL | Status: AC

## 2013-08-23 NOTE — Progress Notes (Signed)
Pt signed d/c papers. IV d/c'd. Dressing to back changed, clean dry and intact. Extra supplies given for home, instructions given on keeping incision clean, dry, and changing dressing as needed for soilage/drainage. Instructions given to resume ADLs slowly as tolerated, and to avoid driving until cleared by neurologist. Friends intend to check in on pt multiple times a day. Pt expresses understanding. Reviewed pain medication regimen and discussed keeping track of how much medication is being taken and taking medications only as recommended by MD. Pt agreeable. Will continue to monitor.

## 2013-08-23 NOTE — Discharge Summary (Signed)
Physician Discharge Summary  Patient ID: Betty Fuentes MRN: [id number] DOB/AGE: May 29, 1960 53 y.o.  Admit date: 08/17/2013 Discharge date: 08/23/2013    Discharge Diagnoses:  Principal Problem:   Encephalopathy acute Active Problems:   Sepsis   Acute respiratory failure   Rhabdomyolysis   AKI (acute kidney injury)   Narcotic overdose   Hypophosphatemia   Hypomagnesemia                                                                   D/c plan --   Acute encephalopathy 2nd to renal/respiratory failure >> most likely from accidental opiate overdose.  Recent lumbar spine surgery >> appreciate input from Dr. Annette Stable.  Hx of depression, anxiety.  Deconditioning. D/c plan --  Resume MODIFIED home regimen as below  Close outpt f/u  Discussed with patient and contact person  nsgy follow up as previously scheduled   Acute respiratory failure 2nd to altered mental status  D/c plan --  No pulm f/u needed   Shock >> most likely from hypovolemia, less likely sepsis >> resolved.  Hx of HTN with chronic diastolic dysfx. D/c plan --  Resume home antiHTN  PCP f/u   AKI 2nd to volume depletion >> resolved 4/23.  Metabolic acidosis >> from hypovolemia, renal failure, and metformin use >> resolved 4/23.  Hypomagnesemia, hypophosphatemia.  Hyperkalemia >> from acidosis >> resolved. D/c plan --  Resume home rx  PCP f/u  Will need cbc, bmet   Iron deficiency anemia >> no obvious bleeding. D/c plan --  ferrous sulfate, new rx   Hx of DM type II. D/c plan --  Resume glucotrol, glucophage  Per PCP    Brief Summary: Betty Fuentes is a 53 y.o. y/o female with a PMH of anxiety, DM, HTN recent L4/L5 fusion, found down 4/21 by a friend.  Initially taken to [facility] and found to be altered, agitated, hypotensive requiring pressors.  She was intubated, started on bicarb gtt and tx to [facility].  Her friend and contact person stated that she has been taking entirely too much pain medication,  getting multiple prescriptions from different providers (neurologist, pain mgmt, neurosurgery post op, PCP) and was "out of it" when he saw her 1 week prior to admit. UDS was pos benzo, opioids.  Tylenol and asa levels wnl.  WBC were wnl. Head CT was neg and surgical site without obvious s/s infection.  She was seen by Dr. Annette Stable who felt no concern for CSF fistula.  All sedating home meds were held and slowly restarted post extubation at adjusted doses.  She weaned well and was extubated from vent after 2 days.  Course was c/b shock, likely r/t hypovolemia and resolved with fluids and short term pressor support.   Course also c/b acute renal failure r/t volume depletion and metformin use, metabolic acidosis from hypovolemia and renal failure treated with HCO3 gtt.  Renal function improved quickly and acidosis resolved.  She was treated with abx empirically but cultures all negative and abx were stopped after 3 days. She is now near baseline, ambulating without difficulty, awake, alert and oriented and ready for d/c home on modified medication regimen, pending close outpt follow up.    SIGNIFICANT EVENTS: 4/22 Admit with acute respiratory failure,  neurosurgery consulted  4/23 d/c HCO3 gtt, off pressors  4/24 Transfer to medical floor   STUDIES:  4/21 CT head >> Lt frontal scalp hematoma  4/21 CT lumbar spine >> no focal fluid collections  4/22 BLE venous dopplers - NEG dvt   LINES / TUBES:  OETT 4/21>> 4/23  R IJ CVL 4/21>>   CULTURES:  BC 4/21>> neg  Urine 421>>> neg  Resp 4/21>> normal flora  UC 4/21>> negative  C diff 4/24 >> neg  ANTIBIOTICS:  Vanc 4/21>> 4/23  Zosyn 4/21>> 4/24   Filed Vitals:   08/22/13 2035 08/23/13 0500 08/23/13 0525 08/23/13 0722  BP: 142/92  123/83 134/91  Pulse: 97  105 105  Temp: 98.7 F (37.1 C)  99.1 F (37.3 C) 97.9 F (36.6 C)  TempSrc: Oral  Oral Oral  Resp: _0 Height: _1  (1.6 m)     Weight: 179 lb 11.2 oz (81.511 kg) 178 lb 12.8 oz  (81.103 kg)    SpO2: 94%  96% 97%     Discharge Labs  BMET  Recent Labs Lab 08/17/13 2020 08/17/13 2359 08/18/13 0500 08/18/13 1600 08/19/13 0550 08/20/13 0500 08/21/13 0708  NA 139  --  145 146 144 144 144  K 5.0  --  4.3 2.7* 3.9 3.6* 3.8  CL 104  --  111 114* 107 107 108  CO2 16*  --  17* 18* _2 GLUCOSE 107*  --  117* 148* 148* 103* 106*  BUN 59*  --  46* 22 28* 17 16  CREATININE 5.27*  --  2.57* 0.89 0.76 0.70 0.68  CALCIUM 8.2*  --  8.1* 6.3* 8.7 8.4 8.5  MG  --  1.3* 2.1  --  1.0* 1.1* 1.3*  PHOS  --  3.7 4.3  --  1.4* 2.4  --      CBC   Recent Labs Lab 08/19/13 0550 08/20/13 0500 08/21/13 0708  HGB 7.2* 7.2* 8.4*  HCT 22.9* 22.4* 26.8*  WBC 7.6 6.1 9.3  PLT 384 315 381   Anti-Coagulation  Recent Labs Lab 08/17/13 1816  INR 1.14      Discharge Orders   Future Orders Complete By Expires   Diet - low sodium heart healthy  As directed    Discharge instructions  As directed            Follow-up Information   Follow up with Velta Addison, Claretha Cooper, DO. Schedule an appointment as soon as possible for a visit in 1 week.   Specialty:  Osteopathic Medicine   Contact information:   [address] [place]  [address] [phone]       Follow up with POOL,HENRY A, MD In 1 week. (as previously scheduled )    Specialty:  Neurosurgery   Contact information:   [address] [place] [address] [phone]       Follow up with [facility], KOFI, MD In 2 weeks.   Specialty:  Neurology   Contact information:   [address] [place] [address] [phone]          Medication List    STOP taking these medications       diazepam 5 MG tablet  Commonly known as:  VALIUM     hydrochlorothiazide 12.5 MG capsule  Commonly known as:  MICROZIDE     HYDROcodone-acetaminophen 7.5-325 MG per tablet  Commonly known as:  NORCO  potassium chloride SA 20 MEQ tablet  Commonly known as:  K-DUR,KLOR-CON      TAKE  these medications       acetaminophen 325 MG tablet  Commonly known as:  TYLENOL  Take 2 tablets (650 mg total) by mouth every 4 (four) hours as needed for mild pain or headache (temperature >/= 99.5 F).     amitriptyline 25 MG tablet  Commonly known as:  ELAVIL  Take 25 mg by mouth at bedtime.     aspirin 325 MG tablet  Take 1 tablet (325 mg total) by mouth daily.     citalopram 20 MG tablet  Commonly known as:  CELEXA  Take 20 mg by mouth daily.     clonazePAM 0.5 MG tablet  Commonly known as:  KLONOPIN  Take 0.5 mg by mouth 3 (three) times daily as needed for anxiety.     cyclobenzaprine 10 MG tablet  Commonly known as:  FLEXERIL  Take 10 mg by mouth at bedtime as needed for muscle spasms.     ferrous sulfate 325 (65 FE) MG tablet  Take 1 tablet (325 mg total) by mouth 2 (two) times daily with a meal.     gabapentin 300 MG capsule  Commonly known as:  NEURONTIN  Take 1 capsule (300 mg total) by mouth 3 (three) times daily.     glipiZIDE 5 MG tablet  Commonly known as:  GLUCOTROL  Take 5 mg by mouth daily before breakfast.     ibuprofen 800 MG tablet  Commonly known as:  ADVIL,MOTRIN  Take 1 tablet (800 mg total) by mouth every 8 (eight) hours as needed.     lisinopril-hydrochlorothiazide 20-12.5 MG per tablet  Commonly known as:  PRINZIDE,ZESTORETIC  Take 1 tablet by mouth daily.     metFORMIN 1000 MG tablet  Commonly known as:  GLUCOPHAGE  Take 1,000 mg by mouth 2 (two) times daily with a meal.     metoprolol 50 MG tablet  Commonly known as:  LOPRESSOR  Take 50 mg by mouth daily.     oxyCODONE-acetaminophen 5-325 MG per tablet  Commonly known as:  PERCOCET/ROXICET  Take 1-2 tablets by mouth every 6 (six) hours as needed (pain).     polyethylene glycol packet  Commonly known as:  MIRALAX / GLYCOLAX  Take 17 g by mouth daily as needed for moderate constipation.     traZODone 100 MG tablet  Commonly known as:  DESYREL  Take 0.5 tablets (50 mg total) by  mouth at bedtime as needed for sleep.          Disposition: Home   Discharged Condition: Betty Fuentes has met maximum benefit of inpatient care and is medically stable and cleared for discharge.  Patient is pending follow up as above.      Time spent on disposition:  Greater than 35 minutes.   Signed: Marijean Heath, NP 08/23/2013  11:10 AM Pager: [phone] or [phone]  *Care during the described time interval was provided by me and/or other providers on the critical care team. I have reviewed this patient's available data, including medical history, events of note, physical examination and test results as part of my evaluation.  Patient seen and examined, agree with above note.  I dictated the care and orders written for this patient under my direction.  Rush Farmer, MD [phone]

## 2013-08-23 NOTE — Evaluation (Signed)
Occupational Therapy Evaluation Patient Details Name: Betty Fuentes MRN: [id number] DOB: [birth year] Today's Date: 08/23/2013    History of Present Illness Pt admit with acute encephalopathy.     Clinical Impression   Pt is alert and oriented. She is performing ADL and mobility at a supervision level, but does not generalize her back precaution in ADL.  Pt wishes to go home and has 24 hour assist of her friend upon discharge.  Will follow acutely to reinforce precautions and safety.  Follow Up Recommendations  No OT follow up;Supervision/Assistance - 24 hour (initially)    Equipment Recommendations  None recommended by OT    Recommendations for Other Services       Precautions / Restrictions Precautions Precautions: Back (recent back sx) Precaution Comments: instructed pt in back precautions Restrictions Weight Bearing Restrictions: No      Mobility Bed Mobility Overal bed mobility: Needs Assistance Bed Mobility: Rolling;Sidelying to Sit Rolling: Independent Sidelying to sit: Independent       General bed mobility comments: needs cues for log roll technique  Transfers Overall transfer level: Needs assistance Equipment used: None Transfers: Sit to/from Stand Sit to Stand: Supervision              Balance                                            ADL Overall ADL's : Needs assistance/impaired Eating/Feeding: Independent;Sitting   Grooming: Wash/dry hands;Supervision/safety   Upper Body Bathing: Set up;Sitting   Lower Body Bathing: Supervison/ safety;Sit to/from stand   Upper Body Dressing : Set up;Sitting   Lower Body Dressing: Supervision/safety;Sit to/from stand   Toilet Transfer: Retail bankerupervision/safety;Regular Toilet;Ambulation   Toileting- Clothing Manipulation and Hygiene: Supervision/safety;Sit to/from stand       Functional mobility during ADLs: Supervision/safety General ADL Comments: Pt able to cross her foot over  opposite knee to access feet.  Instructed in back precautions related to ADL, but pt did not generalize within the same session.     Vision                 Additional Comments: wears contacts   Perception     Praxis      Pertinent Vitals/Pain 5/10, back, RN notified, VSS     Hand Dominance     Extremity/Trunk Assessment Upper Extremity Assessment Upper Extremity Assessment: Overall WFL for tasks assessed   Lower Extremity Assessment Lower Extremity Assessment: Defer to PT evaluation   Cervical / Trunk Assessment Cervical / Trunk Assessment: Normal   Communication Communication Communication: No difficulties   Cognition Arousal/Alertness: Awake/alert Behavior During Therapy: WFL for tasks assessed/performed Overall Cognitive Status: No family/caregiver present to determine baseline cognitive functioning Area of Impairment: Memory     Memory: Decreased short-term memory         General Comments: confusion has resolved   General Comments       Exercises       Shoulder Instructions      Home Living Family/patient expects to be discharged to:: Private residence Living Arrangements: Alone Available Help at Discharge: Friend(s);Available 24 hours/day Type of Home: Mobile home Home Access: Stairs to enter Entrance Stairs-Number of Steps: 3 Entrance Stairs-Rails: Right Home Layout: One level     Bathroom Shower/Tub: Chief Strategy OfficerTub/shower unit   Bathroom Toilet: Standard     Home Equipment: Environmental consultantWalker - 4 wheels;Cane - single point;Wheelchair -  manual;Bedside commode;Shower seat;Adaptive equipment Adaptive Equipment: Reacher;Long-handled shoe horn        Prior Functioning/Environment Level of Independence: Independent with assistive device(s) (used reacher)        Comments: pt did not generalize back precautions this visit    OT Diagnosis: Acute pain;Cognitive deficits   OT Problem List: Decreased cognition;Decreased knowledge of precautions;Pain   OT  Treatment/Interventions: Self-care/ADL training;DME and/or AE instruction;Patient/family education    OT Goals(Current goals can be found in the care plan section) Acute Rehab OT Goals Patient Stated Goal: to go home OT Goal Formulation: With patient Time For Goal Achievement: 08/30/13 Potential to Achieve Goals: Good ADL Goals Pt Will Perform Tub/Shower Transfer: with modified independence;ambulating;Tub transfer;shower seat Additional ADL Goal #1: Pt will generalize back precautions in mobility and ADL independently. Additional ADL Goal #2: Pt will gather items needed for ADL around her room independently.  OT Frequency: Min 2X/week   Barriers to D/C:            Co-evaluation              End of Session Nurse Communication: Patient requests pain meds (?chair alarm-not needed)  Activity Tolerance: Patient tolerated treatment well Patient left: in chair;with call bell/phone within reach   Time: 0850-0920 OT Time Calculation (min): 30 min Charges:  OT General Charges $OT Visit: 1 Procedure OT Evaluation $Initial OT Evaluation Tier I: 1 Procedure OT Treatments $Self Care/Home Management : 8-22 mins G-Codes:    [place] BailiffJulie Lynn Lunna Vogelgesang 08/23/2013, 9:29 AM [phone]

## 2014-08-23 ENCOUNTER — Other Ambulatory Visit (HOSPITAL_COMMUNITY): Payer: Self-pay | Admitting: Nurse Practitioner

## 2014-08-23 DIAGNOSIS — Z1231 Encounter for screening mammogram for malignant neoplasm of breast: Secondary | ICD-10-CM

## 2014-09-08 ENCOUNTER — Ambulatory Visit (HOSPITAL_COMMUNITY): Payer: Medicaid Other

## 2015-04-06 IMAGING — CT CT L SPINE W/O CM
3 of 9 series · 10 of 33 positions shown, 12 images · non-contrast
Comparison: MR lumbar spine 05/27/2013.

CLINICAL DATA: Patient found down.

EXAM:
CT LUMBAR SPINE WITHOUT CONTRAST
TECHNIQUE: Multidetector CT imaging of the lumbar spine was performed without
intravenous contrast administration. Multiplanar CT image
reconstructions were also generated.

[Series 3: lumbar spine 2.0 b30s · axial · 0.36mm/px · z∈[+228,+352]mm · 2 of 144 slices shown, 3 images]
[im 41/144  soft-tissue]
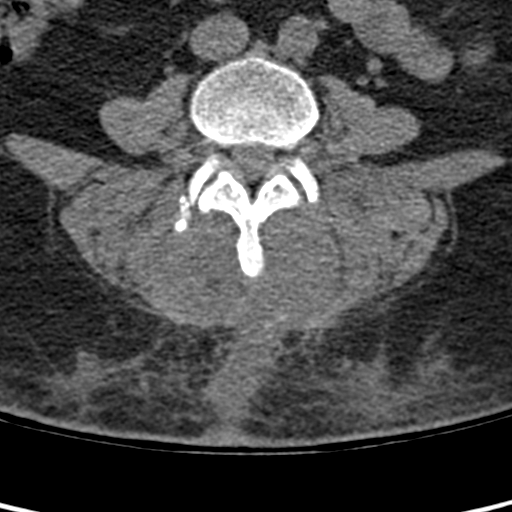
[im 41/144  bone]
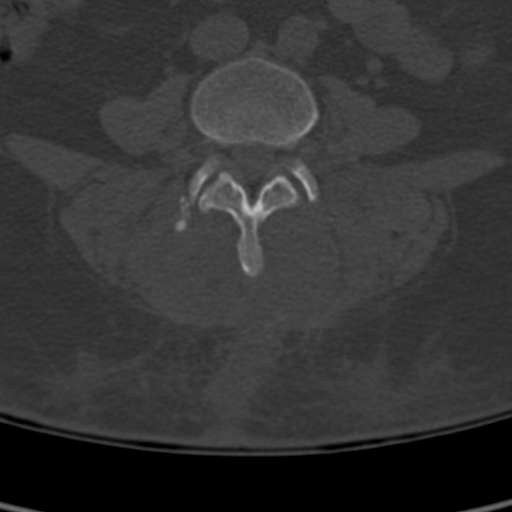
[im 103/144  bone]
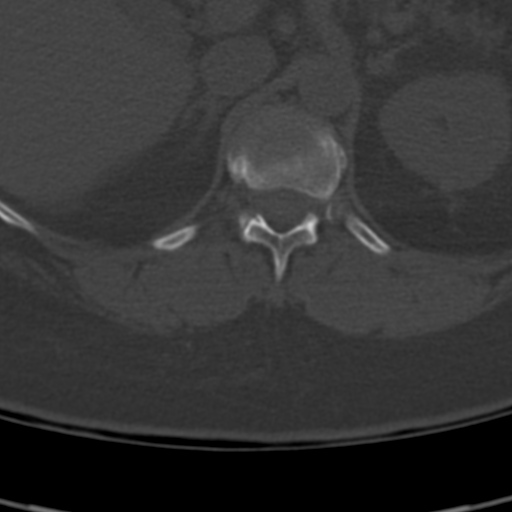

[Series 4: lumbar spine 2.0 spo cor · coronal · 0.31mm/px · 3 of 69 slices shown]
[im 14/69  bone]
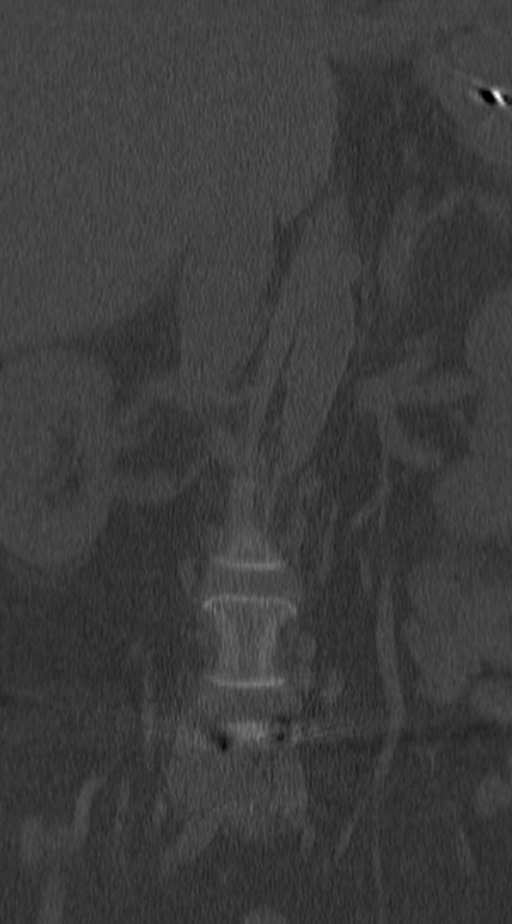
[im 28/69  bone]
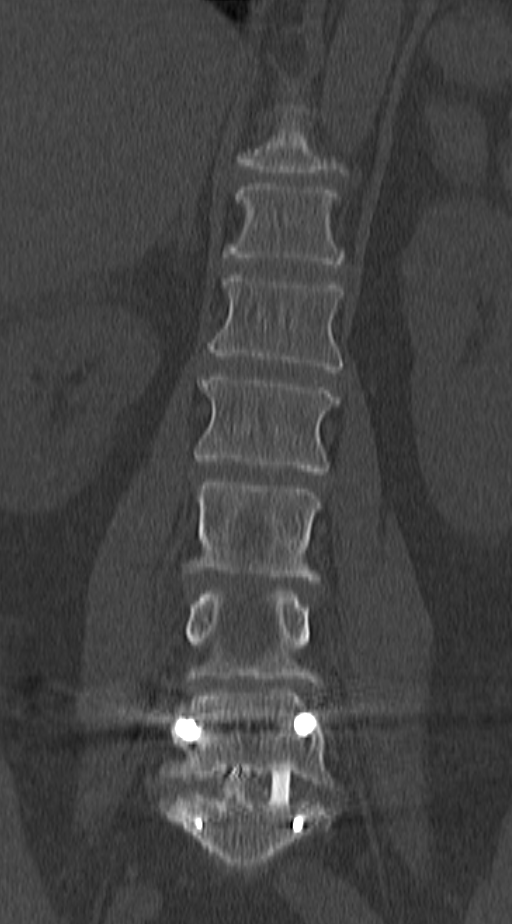
[im 41/69  bone]
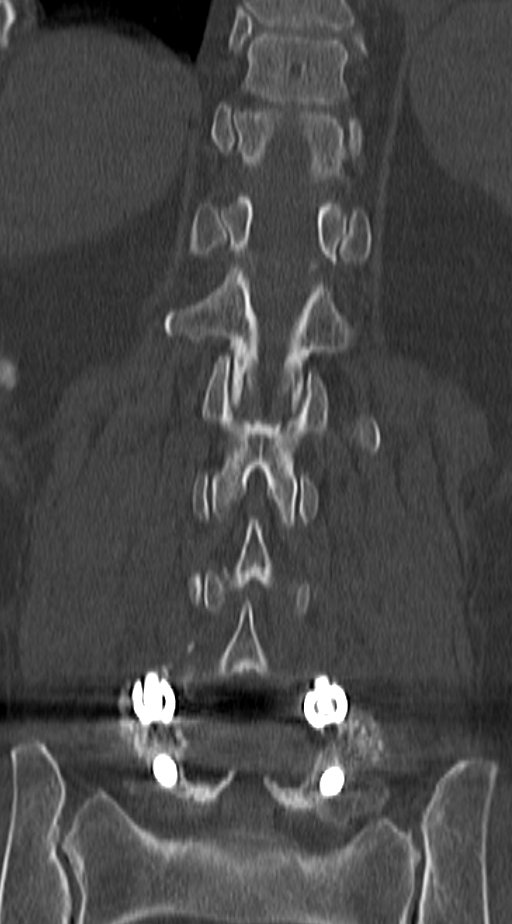

[Series 5: lumbar spine 2.0 spo sag · sagittal · 0.30mm/px · 5 of 64 slices shown, 6 images]
[im 22/64  bone]
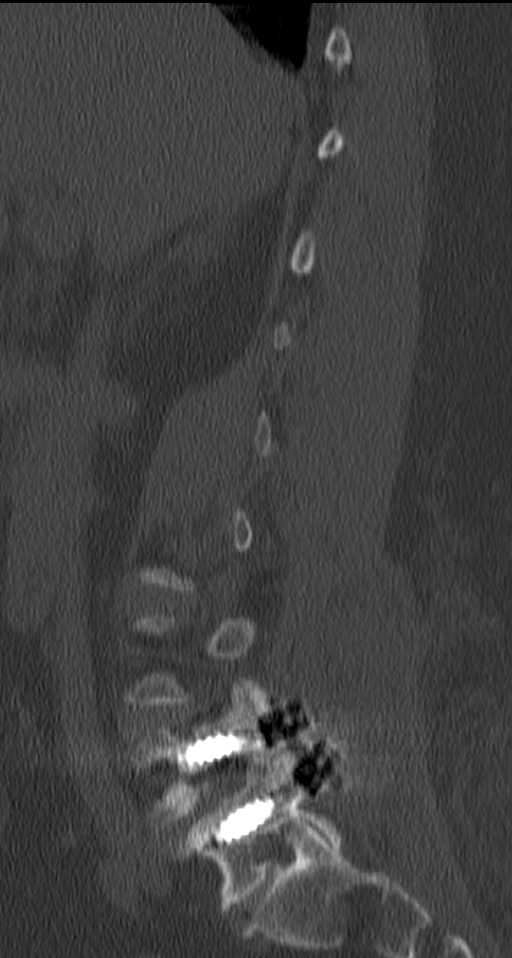
[im 27/64  bone]
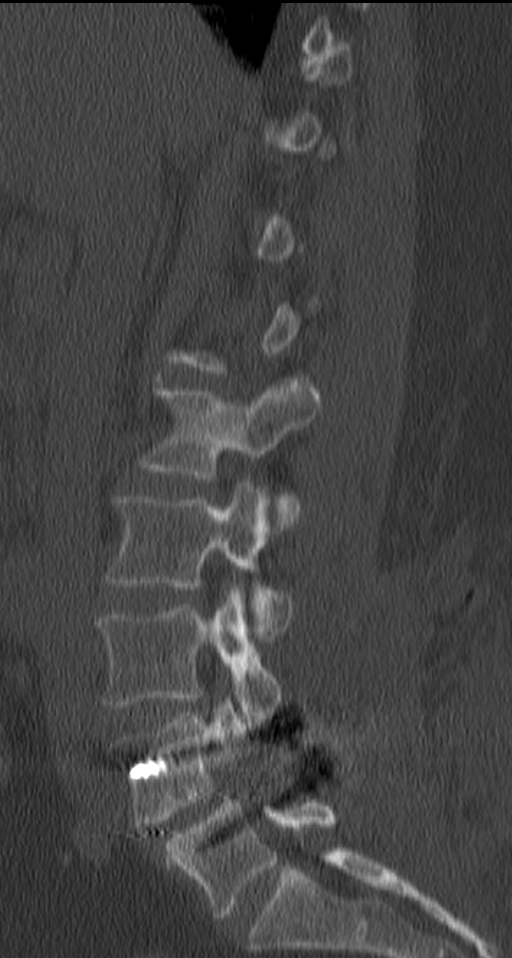
[im 32/64  soft-tissue]
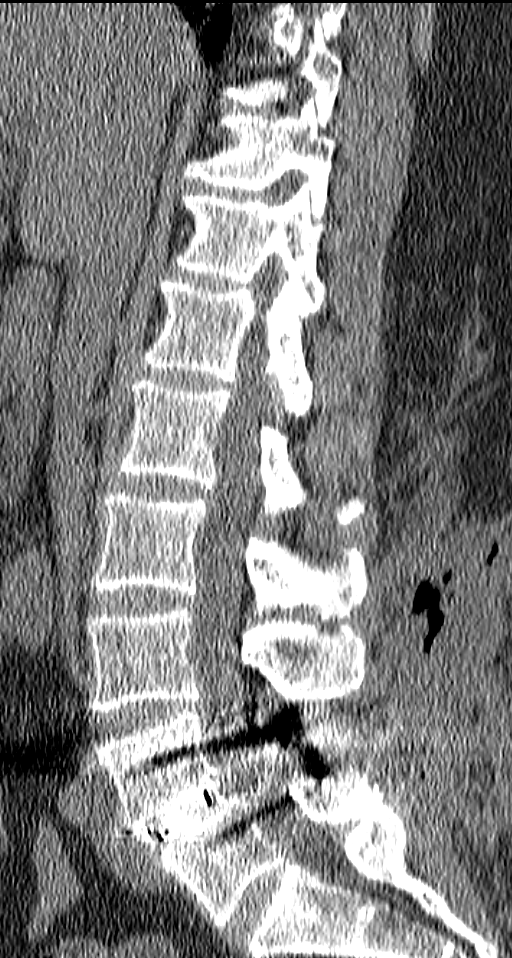
[im 32/64  bone]
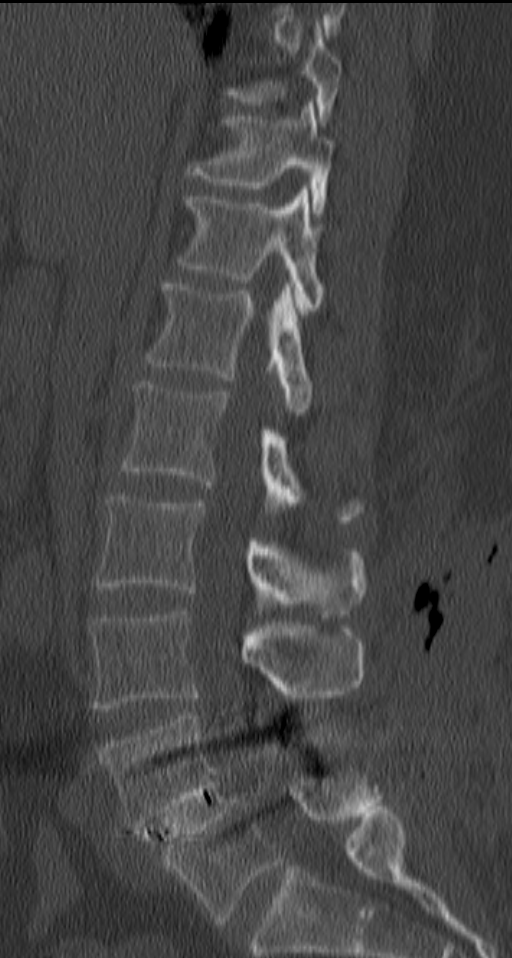
[im 37/64  bone]
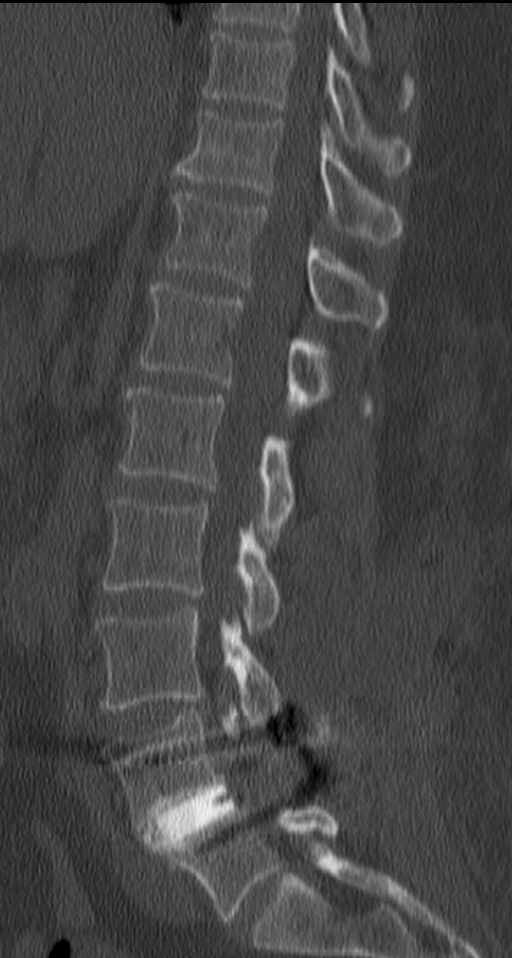
[im 43/64  bone]
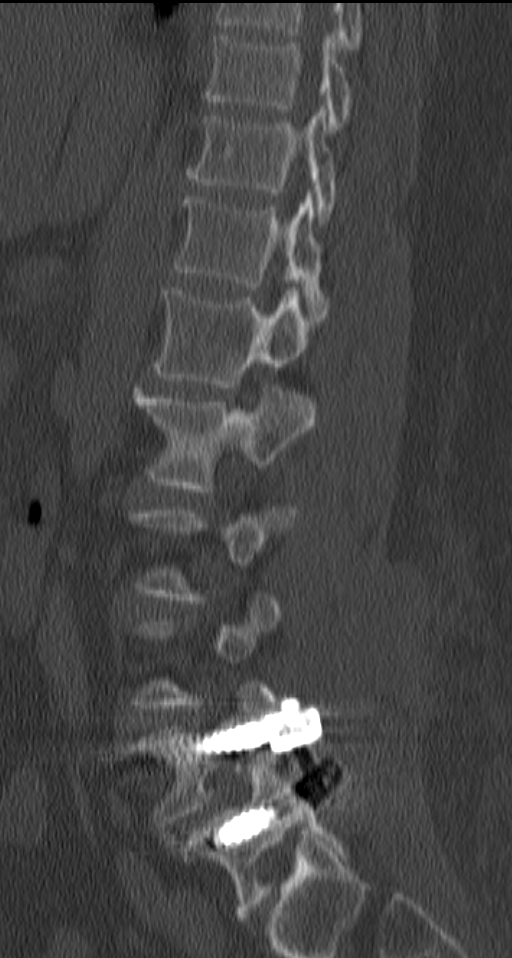

[10 of 33 positions shown; findings below may reference images not displayed]

FINDINGS: Since the prior MRI, the patient has undergone L4-5 fusion. Pedicle
screws and stabilization bars are in place with an interbody spacer
identified. Hardware is intact and well-positioned. There is some
incorporation of interbody spacer into the endplates of L4 and L5
and coalescence of graft material about the posterior elements.
cm anterolisthesis L4 on L5 seen on the prior MRI has been nearly
completely reduced with only trace anterolisthesis identified on
today's examination. No fracture is identified. There is a small
amount of air in subcutaneous tissues along the midline wound. No
focal fluid collection is identified. The central spinal canal
appears widely patent at all levels. Neural foramina appear open.
Small left pleural effusion is noted. NG tube is in place.
IMPRESSION: Status post L4-5 fusion. There is some gas in the subcutaneous soft
tissues of the back presumably related to surgery. No focal fluid
collection is identified. Stabilization hardware is intact without
evidence of loosening or other complicating feature.

Negative for fracture.

Small left pleural effusion.

## 2015-04-07 IMAGING — DX DG ABD PORTABLE 1V
1 series · 1 of 1 positions shown · non-contrast
Comparison: Scout image from CT abdomen 02/20/2009

CLINICAL DATA: Orogastric tube placement

EXAM:
PORTABLE ABDOMEN - 1 VIEW

[supine ap]
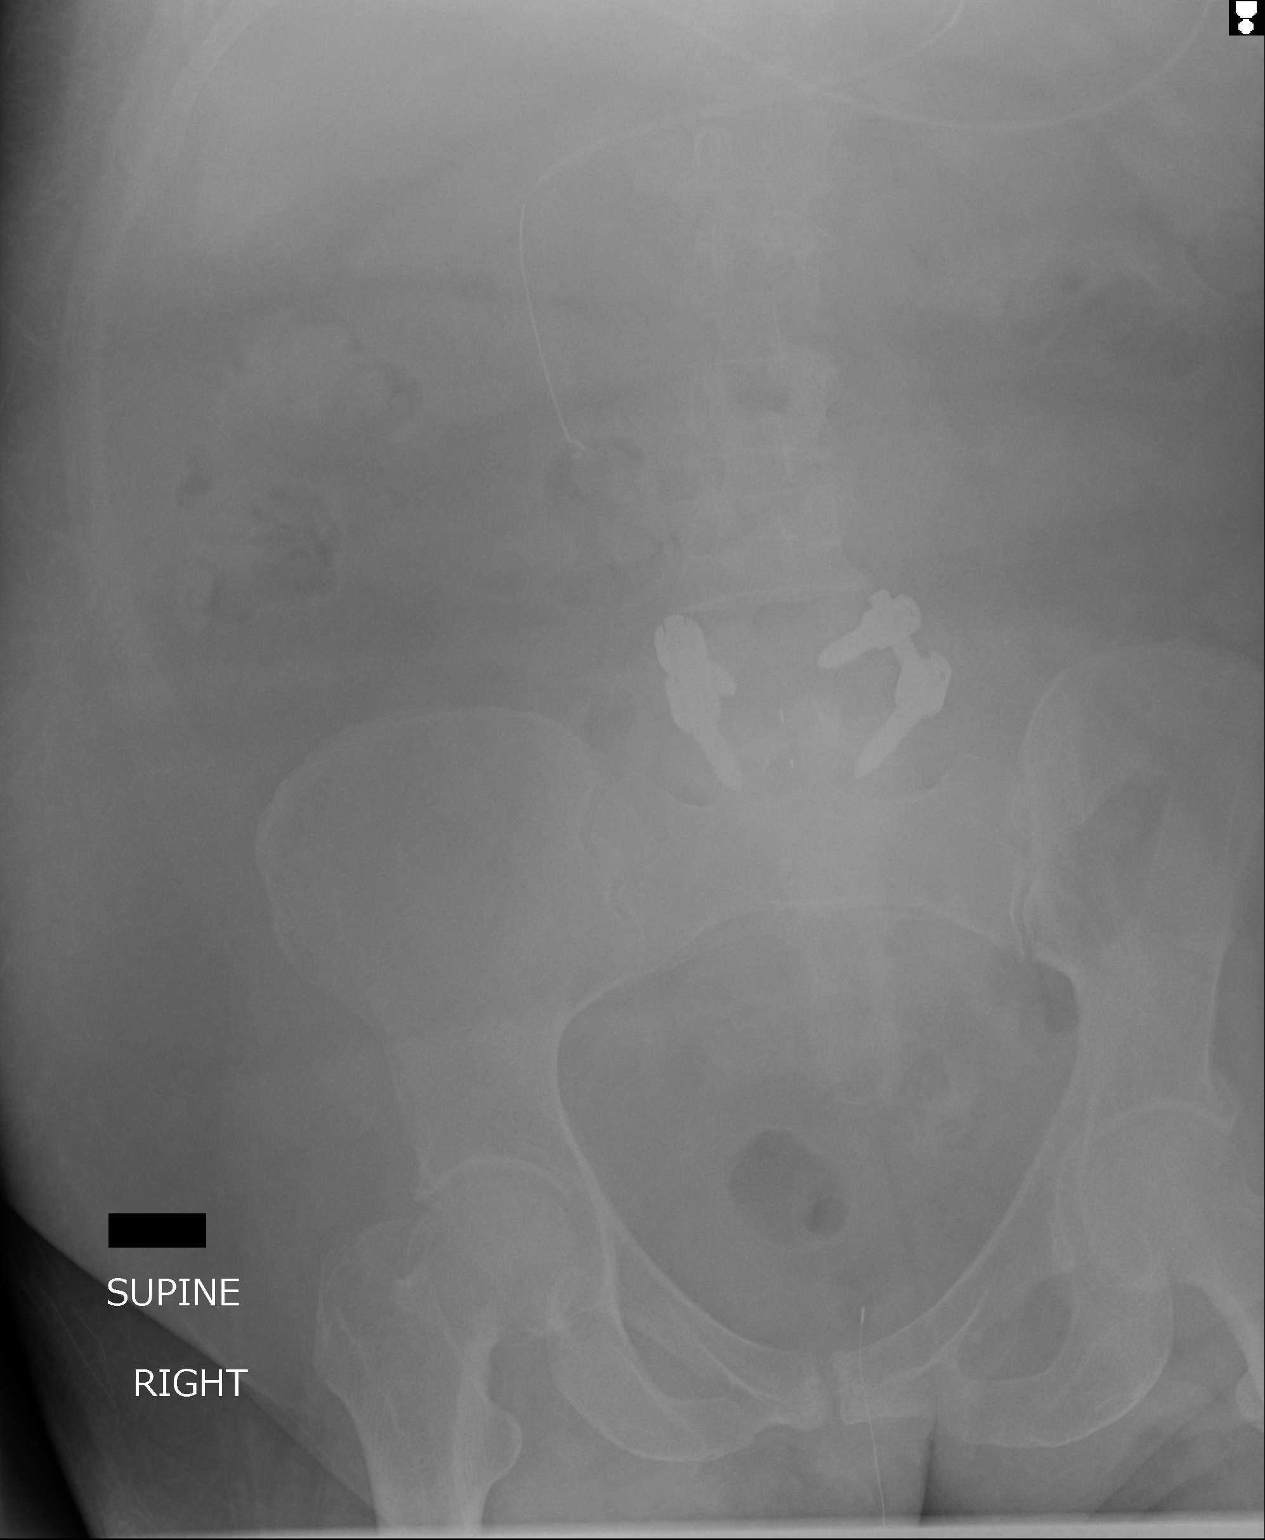

[1 of 1 positions shown; findings below may reference images not displayed]

FINDINGS: Tip of nasogastric tube projects over the expected position of the
distal second portion of the duodenum.

Bowel gas pattern normal.

Prior lumbar fusion.

Osseous demineralization.

No urinary tract calcification.
IMPRESSION: Tip of nasogastric tube projects over the expected position of the
distal second portion of the duodenum.
